# Patient Record
Sex: Male | Born: 1980 | Hispanic: Yes | Marital: Single | Smoking: Never smoker
Health system: Southern US, Community
[De-identification: ages and names within clinical notes are randomized; demographics above are authoritative.]

## PROBLEM LIST (undated history)

## (undated) DIAGNOSIS — I639 Cerebral infarction, unspecified: Secondary | ICD-10-CM

## (undated) HISTORY — PX: HERNIA REPAIR: SHX51

---

## 2020-05-05 ENCOUNTER — Encounter: Payer: Self-pay | Admitting: Emergency Medicine

## 2020-05-05 ENCOUNTER — Emergency Department: Payer: Self-pay

## 2020-05-05 ENCOUNTER — Inpatient Hospital Stay
Admission: EM | Admit: 2020-05-05 | Discharge: 2020-05-09 | DRG: 871 | Disposition: A | Discharge status: Home / Self Care | Payer: Self-pay | Attending: Internal Medicine | Admitting: Internal Medicine

## 2020-05-05 ENCOUNTER — Other Ambulatory Visit: Payer: Self-pay

## 2020-05-05 DIAGNOSIS — K760 Fatty (change of) liver, not elsewhere classified: Secondary | ICD-10-CM | POA: Diagnosis present

## 2020-05-05 DIAGNOSIS — R509 Fever, unspecified: Secondary | ICD-10-CM

## 2020-05-05 DIAGNOSIS — A419 Sepsis, unspecified organism: Principal | ICD-10-CM

## 2020-05-05 DIAGNOSIS — Y92239 Unspecified place in hospital as the place of occurrence of the external cause: Secondary | ICD-10-CM | POA: Diagnosis not present

## 2020-05-05 DIAGNOSIS — Z20822 Contact with and (suspected) exposure to covid-19: Secondary | ICD-10-CM | POA: Diagnosis present

## 2020-05-05 DIAGNOSIS — R0600 Dyspnea, unspecified: Secondary | ICD-10-CM

## 2020-05-05 DIAGNOSIS — J9601 Acute respiratory failure with hypoxia: Secondary | ICD-10-CM | POA: Diagnosis present

## 2020-05-05 DIAGNOSIS — R000 Tachycardia, unspecified: Secondary | ICD-10-CM

## 2020-05-05 DIAGNOSIS — J90 Pleural effusion, not elsewhere classified: Secondary | ICD-10-CM

## 2020-05-05 DIAGNOSIS — R079 Chest pain, unspecified: Secondary | ICD-10-CM

## 2020-05-05 DIAGNOSIS — T363X5A Adverse effect of macrolides, initial encounter: Secondary | ICD-10-CM | POA: Diagnosis not present

## 2020-05-05 DIAGNOSIS — J189 Pneumonia, unspecified organism: Secondary | ICD-10-CM | POA: Diagnosis present

## 2020-05-05 DIAGNOSIS — R220 Localized swelling, mass and lump, head: Secondary | ICD-10-CM | POA: Diagnosis not present

## 2020-05-05 DIAGNOSIS — Z79899 Other long term (current) drug therapy: Secondary | ICD-10-CM

## 2020-05-05 DIAGNOSIS — J181 Lobar pneumonia, unspecified organism: Secondary | ICD-10-CM | POA: Diagnosis present

## 2020-05-05 LAB — COMPREHENSIVE METABOLIC PANEL
ALT: 52 U/L — ABNORMAL HIGH (ref 0–44)
AST: 29 U/L (ref 15–41)
Albumin: 4.2 g/dL (ref 3.5–5.0)
Alkaline Phosphatase: 68 U/L (ref 38–126)
Anion gap: 11 (ref 5–15)
BUN: 13 mg/dL (ref 6–20)
CO2: 21 mmol/L — ABNORMAL LOW (ref 22–32)
Calcium: 9.3 mg/dL (ref 8.9–10.3)
Chloride: 104 mmol/L (ref 98–111)
Creatinine, Ser: 0.92 mg/dL (ref 0.61–1.24)
GFR, Estimated: >60 mL/min (ref >60)
Glucose, Bld: 134 mg/dL — ABNORMAL HIGH (ref 70–99)
Potassium: 3.8 mmol/L (ref 3.5–5.1)
Sodium: 136 mmol/L (ref 135–145)
Total Bilirubin: 1.9 mg/dL — ABNORMAL HIGH (ref 0.3–1.2)
Total Protein: 8 g/dL (ref 6.5–8.1)

## 2020-05-05 LAB — CBC
HCT: 40.2 % (ref 39.0–52.0)
Hemoglobin: 13.8 g/dL (ref 13.0–17.0)
MCH: 28.6 pg (ref 26.0–34.0)
MCHC: 34.3 g/dL (ref 30.0–36.0)
MCV: 83.2 fL (ref 80.0–100.0)
Platelets: 263 10*3/uL (ref 150–400)
RBC: 4.83 MIL/uL (ref 4.22–5.81)
RDW: 13.2 % (ref 11.5–15.5)
WBC: 22.6 10*3/uL — ABNORMAL HIGH (ref 4.0–10.5)
nRBC: 0 % (ref 0.0–0.2)

## 2020-05-05 LAB — LIPASE, BLOOD: Lipase: 29 U/L (ref 11–51)

## 2020-05-05 LAB — TROPONIN I (HIGH SENSITIVITY)
Troponin I (High Sensitivity): 4 ng/L (ref <18)
Troponin I (High Sensitivity): 2 ng/L (ref <18)

## 2020-05-05 LAB — SARS CORONAVIRUS 2 (TAT 6-24 HRS): SARS Coronavirus 2: NEGATIVE

## 2020-05-05 LAB — URINALYSIS, COMPLETE (UACMP) WITH MICROSCOPIC
Bilirubin Urine: NEGATIVE
Glucose, UA: 50 mg/dL — AB
Ketones, ur: 20 mg/dL — AB
Leukocytes,Ua: NEGATIVE
Nitrite: NEGATIVE
Protein, ur: 30 mg/dL — AB
Specific Gravity, Urine: 1.015 (ref 1.005–1.030)
Squamous Epithelial / HPF: NONE SEEN (ref 0–5)
pH: 6 (ref 5.0–8.0)

## 2020-05-05 LAB — PROTIME-INR
INR: 1.1 (ref 0.8–1.2)
Prothrombin Time: 14.5 seconds (ref 11.4–15.2)

## 2020-05-05 LAB — GLUCOSE, CAPILLARY: Glucose-Capillary: 121 mg/dL — ABNORMAL HIGH (ref 70–99)

## 2020-05-05 LAB — LACTIC ACID, PLASMA
Lactic Acid, Venous: 1.1 mmol/L (ref 0.5–1.9)
Lactic Acid, Venous: 2.1 mmol/L (ref 0.5–1.9)
Lactic Acid, Venous: 1.6 mmol/L (ref 0.5–1.9)

## 2020-05-05 LAB — APTT: aPTT: 34 seconds (ref 24–36)

## 2020-05-05 MED ORDER — IOHEXOL 350 MG/ML SOLN
75.0000 mL | Freq: Once | INTRAVENOUS | Status: AC | PRN
  Administered 2020-05-05: 75 mL via INTRAVENOUS

## 2020-05-05 MED ORDER — ENOXAPARIN SODIUM 40 MG/0.4ML ~~LOC~~ SOLN
40.0000 mg | SUBCUTANEOUS | Status: DC
  Administered 2020-05-05 – 2020-05-06 (×2): 40 mg via SUBCUTANEOUS
  Filled 2020-05-05: qty 0.4

## 2020-05-05 MED ORDER — MORPHINE SULFATE (PF) 4 MG/ML IV SOLN
4.0000 mg | Freq: Once | INTRAVENOUS | Status: AC
  Administered 2020-05-05: 4 mg via INTRAVENOUS
  Filled 2020-05-05: qty 1

## 2020-05-05 MED ORDER — ONDANSETRON HCL 4 MG/2ML IJ SOLN: 4.0000 mg | Freq: Four times a day (QID) | INTRAMUSCULAR | Status: DC | PRN

## 2020-05-05 MED ORDER — KETOROLAC TROMETHAMINE 15 MG/ML IJ SOLN
15.0000 mg | Freq: Three times a day (TID) | INTRAMUSCULAR | Status: AC | PRN
  Administered 2020-05-05 – 2020-05-07 (×4): 15 mg via INTRAVENOUS
  Filled 2020-05-05 (×7): qty 1

## 2020-05-05 MED ORDER — SODIUM CHLORIDE 0.9 % IV SOLN
2.0000 g | Freq: Once | INTRAVENOUS | Status: AC
  Administered 2020-05-05: 2 g via INTRAVENOUS
  Filled 2020-05-05: qty 20

## 2020-05-05 MED ORDER — LACTATED RINGERS IV BOLUS
1000.0000 mL | Freq: Once | INTRAVENOUS | Status: AC
  Administered 2020-05-05: 1000 mL via INTRAVENOUS

## 2020-05-05 MED ORDER — SODIUM CHLORIDE 0.9 % IV SOLN
500.0000 mg | INTRAVENOUS | Status: DC
  Administered 2020-05-05 – 2020-05-07 (×3): 500 mg via INTRAVENOUS
  Filled 2020-05-05 (×3): qty 500

## 2020-05-05 MED ORDER — ACETAMINOPHEN 325 MG PO TABS
650.0000 mg | ORAL_TABLET | Freq: Four times a day (QID) | ORAL | Status: DC | PRN
  Administered 2020-05-05 – 2020-05-08 (×6): 650 mg via ORAL
  Filled 2020-05-05 (×8): qty 2

## 2020-05-05 MED ORDER — SODIUM CHLORIDE 0.9 % IV SOLN
500.0000 mg | Freq: Once | INTRAVENOUS | Status: DC
  Filled 2020-05-05: qty 500

## 2020-05-05 MED ORDER — MORPHINE SULFATE (PF) 4 MG/ML IV SOLN
4.0000 mg | INTRAVENOUS | Status: DC | PRN
Start: 2020-05-05 — End: 2020-05-09
  Administered 2020-05-06: 4 mg via INTRAVENOUS
  Filled 2020-05-05: qty 1

## 2020-05-05 MED ORDER — SODIUM CHLORIDE 0.9 % IV BOLUS
1000.0000 mL | Freq: Once | INTRAVENOUS | Status: AC
  Administered 2020-05-05: 1000 mL via INTRAVENOUS

## 2020-05-05 MED ORDER — SODIUM CHLORIDE 0.9 % IV SOLN
2.0000 g | INTRAVENOUS | Status: DC
  Administered 2020-05-06 – 2020-05-08 (×3): 2 g via INTRAVENOUS
  Filled 2020-05-05 (×2): qty 20
  Filled 2020-05-05: qty 2
  Filled 2020-05-05: qty 20
  Filled 2020-05-05: qty 2

## 2020-05-05 MED ORDER — ALBUTEROL SULFATE (2.5 MG/3ML) 0.083% IN NEBU
2.5000 mg | INHALATION_SOLUTION | Freq: Once | RESPIRATORY_TRACT | Status: AC | PRN
  Administered 2020-05-05: 2.5 mg via RESPIRATORY_TRACT
  Filled 2020-05-05: qty 3

## 2020-05-05 MED ORDER — SODIUM CHLORIDE 0.9 % IV SOLN: 2.0000 g | INTRAVENOUS | Status: DC

## 2020-05-05 MED ORDER — SODIUM CHLORIDE 0.9 % IV SOLN: INTRAVENOUS | Status: DC

## 2020-05-05 NOTE — ED Notes (Signed)
MD Agbata sent secure chat about patient condition, has not responded to page.

## 2020-05-05 NOTE — Progress Notes (Signed)
Cross-coverage note:   Patient seen for fever, tachycardia, and tachypnea.   He denies any PMHx, presented with severe left flank pain and pleuritic chest pain and had CTA negative for PE but concerning for possible RLL pneumonia with small effusion. He was cultured, started on Rocephin and azithromycin, and given 3 liter IVF today.   He is resting and reports feeling better after Toradol and neb treatment but has severe right CVA tenderness. Plan to check CT abd/pelvis, control pain, and otherwise continue current management.

## 2020-05-05 NOTE — ED Notes (Addendum)
Pt attempted to stand to use urinal, pt immediately endorsing severe right sided chest pain, became tachypneic with respiration rate in the 40s and tachycardic with heart rate in the 150s. Pt's O2 saturation also decreased to 88%. Pt immediately assisted back into bed by this RN and placed on 2L nasal cannula. After O2 administration and pt lying back down on stretcher, pt's O2 increased to 98% on 2L nasal cannula, heart rate decreased to 117, and respirations decreased to 28. Pt still endorsing severe pain and only has tylenol ordered at this time. Agbata MD paged by secretary to make aware.

## 2020-05-05 NOTE — ED Notes (Signed)
Lab advising lactic acid 2.1, MD Agbata paged to make aware.

## 2020-05-05 NOTE — ED Triage Notes (Signed)
Pt arrived via POV with reports R rib cage pain and shortness of breath. Pt recently returned from the [place] where he had hernia surgery there about 3 weeks ago.  Pain and shortness of breath worse this morning.

## 2020-05-05 NOTE — Progress Notes (Signed)
   05/05/20 1821  Assess: MEWS Score  Temp (!) 102.6 F (39.2 C)  BP 128/85  Pulse Rate (!) 127  SpO2 97 %  Assess: MEWS Score  MEWS Temp 2  MEWS Systolic 0  MEWS Pulse 2  MEWS RR 0  MEWS LOC 0  MEWS Score 4  MEWS Score Color Red  Treat  MEWS Interventions Administered scheduled meds/treatments;Administered prn meds/treatments;Escalated (See documentation below)  Pain Scale 0-10  Pain Score 9  Pain Type Acute pain  Pain Location Back  Take Vital Signs  Increase Vital Sign Frequency  Red: Q 1hr X 4 then Q 4hr X 4, if remains red, continue Q 4hrs  Escalate  MEWS: Escalate Red: discuss with charge nurse/RN and provider, consider discussing with RRT  Notify: Charge Nurse/RN  Name of Charge Nurse/RN Notified Lupita Leash  Date Charge Nurse/RN Notified 05/05/20  Time Charge Nurse/RN Notified 1834  Notify: Provider  Provider Name/Title Agbata  Date Provider Notified 05/05/20  Time Provider Notified [phone]  Notification Type Call  Notification Reason Change in status  Provider response No new orders  Date of Provider Response 05/05/20  Time of Provider Response 1836  Notify: Rapid Response  Name of Rapid Response RN Notified t  Date Rapid Response Notified 05/05/20  Time Rapid Response Notified 1836

## 2020-05-05 NOTE — ED Notes (Signed)
Patient transported to CT 

## 2020-05-05 NOTE — Progress Notes (Signed)
   05/05/20 1612  Assess: MEWS Score  Temp 98.1 F (36.7 C)  BP 132/82  Pulse Rate (!) 118  Resp 18  SpO2 97 %  O2 Device Room Air  Assess: MEWS Score  MEWS Temp 0  MEWS Systolic 0  MEWS Pulse 2  MEWS RR 0  MEWS LOC 0  MEWS Score 2  MEWS Score Color Yellow  Assess: if the MEWS score is Yellow or Red  Were vital signs taken at a resting state? Yes  Focused Assessment No change from prior assessment  Early Detection of Sepsis Score *See Row Information* Medium  MEWS guidelines implemented *See Row Information* No, previously yellow, continue vital signs every 4 hours  Treat  MEWS Interventions Other (Comment) (Paged MD no response)  Pain Scale 0-10  Pain Score 0  Take Vital Signs  Increase Vital Sign Frequency  Yellow: Q 2hr X 2 then Q 4hr X 2, if remains yellow, continue Q 4hrs  Escalate  MEWS: Escalate Yellow: discuss with charge nurse/RN and consider discussing with provider and RRT  Notify: Charge Nurse/RN  Name of Charge Nurse/RN Notified Lupita Leash  Date Charge Nurse/RN Notified 05/05/20  Time Charge Nurse/RN Notified 1630  Notify: Provider  Provider Name/Title Asbata  Date Provider Notified 05/05/20  Time Provider Notified 1630  Notification Type Call  Notification Reason Change in status  Provider response No new orders

## 2020-05-05 NOTE — H&P (Signed)
History and Physical    Kemonte Ullman URK:[id number] DOB: 24-May-1980 DOA: 05/05/2020  PCP: Pcp, No   Patient coming from: Home  I have personally briefly reviewed patient's old medical records in [facility]  Chief Complaint: Chest pain  HPI: Coreon Simkins is a 40 y.o. male with medical history significant for recent inguinal hernia repair in the [place] who presents to the ER via private vehicle for evaluation of pain mostly in the right lower lateral chest wall associated with shortness of breath, fever and a cough. Patient started having symptoms 3 days ago soon after he returned from the [place] where he had hernia surgery.  He describes the pain as pleuritic which prevents him from taking a deep breaths.  He denies having any trauma or falls.  He has a nonproductive cough and he has had fever though undocumented. Due to persistence of his symptoms he presented to the ER for further evaluation.Marland Kitchen He denies having any abdominal pain, no nausea, no vomiting, no urinary frequency, no nocturia, no dysuria, no dizziness, no lightheadedness, no changes in his bowel habits, no blurred vision, no palpitations or diaphoresis. Labs show sodium 130, potassium 3.8, chloride 104, bicarb 21, glucose 134, BUN 13, creatinine 0.92, calcium 9.3, alkaline phosphatase 68, albumin 4.2, lipase 29, AST 29, ALT 52, total protein 8.0, total bilirubin 1.9, troponin 2, lactic acid 1.1, white count 22.6, hemoglobin 13.8, hematocrit 40.2, MCV 83.2, RDW 13.2, platelet count 263, PT 14.5, INR 1.1 Chest x-ray reviewed by me and agree with radiologist report shows low lung volumes with bibasilar atelectasis/infiltrates. CT angiogram of the chest shows no definite evidence of pulmonary embolus. Small right pleural effusion is noted with adjacent right lower lobe pneumonia or atelectasis. Subsegmental atelectasis is noted in both upper lobes and the left lower lobe. Hepatic steatosis. Twelve-lead  EKG reviewed by me shows sinus tachycardia with T wave inversion in inferior leads.   ED Course: Patient is a 40 year old Hispanic male who presents to the emergency room for evaluation of pleuritic chest pain mostly involving the right lower lateral chest wall associated with a nonproductive cough, shortness of breath and fever.  Patient is status post hernia repair which was done about 3 weeks ago and just returned from the [place] 4 days prior to his admission.  CT angiogram is negative for PE but shows a small right pleural effusion with right lower lobe pneumonia.  Patient received IV Rocephin and Zithromax in the ER and will be admitted to the hospital for further evaluation.   Review of Systems: As per HPI otherwise all other systems reviewed and negative.    History reviewed. No pertinent past medical history.  Past Surgical History:  Procedure Laterality Date  . HERNIA REPAIR       reports that he has never smoked. He has never used smokeless tobacco. He reports previous alcohol use. No history on file for drug use.  No Known Allergies  Family History  Family history unknown: Yes      Prior to Admission medications   Medication Sig Start Date End Date Taking? Authorizing Provider  acetaminophen (TYLENOL) 325 MG tablet Take 650 mg by mouth every 6 (six) hours as needed.   Yes [provider]  naproxen (EC NAPROSYN) 500 MG EC tablet Take 500 mg by mouth 2 (two) times daily. Patient not taking: Reported on 05/05/2020 04/04/20   [provider]    Physical Exam: Vitals:   05/05/20 8315 05/05/20 0955 05/05/20 1000  05/05/20 1015  BP:   131/83   Pulse: (!) 150 (!) 117 (!) 106 (!) 109  Resp: (!) 40 (!) 28 (!) 27 (!) 24  Temp:      TempSrc:      SpO2: (!) 88% 98% 99% 100%  Weight:      Height:         Vitals:   05/05/20 0954 05/05/20 0955 05/05/20 1000 05/05/20 1015  BP:   131/83   Pulse: (!) 150 (!) 117 (!) 106 (!) 109  Resp: (!) 40 (!) 28  (!) 27 (!) 24  Temp:      TempSrc:      SpO2: (!) 88% 98% 99% 100%  Weight:      Height:          Constitutional: Alert and oriented x 3 .  Acutely ill-appearing HEENT:      Head: Normocephalic and atraumatic.         Eyes: PERLA, EOMI, Conjunctivae are normal. Sclera is non-icteric.       Mouth/Throat: Mucous membranes are moist.       Neck: Supple with no signs of meningismus. Cardiovascular:  Tachycardic. No murmurs, gallops, or rubs. 2+ symmetrical distal pulses are present . No JVD. No LE edema Respiratory:  Tachypneic.rhonchi right lung base. No wheezes, crackles, or rhonchi.  Gastrointestinal: Soft, non tender, and non distended with positive bowel sounds.  Genitourinary: No CVA tenderness. Musculoskeletal: Nontender with normal range of motion in all extremities. No cyanosis, or erythema of extremities. Neurologic:  Face is symmetric. Moving all extremities. No gross focal neurologic deficits  Skin: Skin is warm, dry.  No rash or ulcers Psychiatric: Mood and affect are normal   Labs on Admission: I have personally reviewed following labs and imaging studies  CBC: Recent Labs  Lab 05/05/20 0646  WBC 22.6*  HGB 13.8  HCT 40.2  MCV 83.2  PLT 263   Basic Metabolic Panel: Recent Labs  Lab 05/05/20 0646  NA 136  K 3.8  CL 104  CO2 21*  GLUCOSE 134*  BUN 13  CREATININE 0.92  CALCIUM 9.3   GFR: Estimated Creatinine Clearance: 88.5 mL/min (by C-G formula based on SCr of 0.92 mg/dL). Liver Function Tests: Recent Labs  Lab 05/05/20 0646  AST 29  ALT 52*  ALKPHOS 68  BILITOT 1.9*  PROT 8.0  ALBUMIN 4.2   Recent Labs  Lab 05/05/20 0646  LIPASE 29   No results for input(s): AMMONIA in the last 168 hours. Coagulation Profile: Recent Labs  Lab 05/05/20 0646  INR 1.1   Cardiac Enzymes: No results for input(s): CKTOTAL, CKMB, CKMBINDEX, TROPONINI in the last 168 hours. BNP (last 3 results) No results for input(s): PROBNP in the last 8760  hours. HbA1C: No results for input(s): HGBA1C in the last 72 hours. CBG: No results for input(s): GLUCAP in the last 168 hours. Lipid Profile: No results for input(s): CHOL, HDL, LDLCALC, TRIG, CHOLHDL, LDLDIRECT in the last 72 hours. Thyroid Function Tests: No results for input(s): TSH, T4TOTAL, FREET4, T3FREE, THYROIDAB in the last 72 hours. Anemia Panel: No results for input(s): VITAMINB12, FOLATE, FERRITIN, TIBC, IRON, RETICCTPCT in the last 72 hours. Urine analysis: No results found for: COLORURINE, APPEARANCEUR, LABSPEC, PHURINE, GLUCOSEU, HGBUR, BILIRUBINUR, KETONESUR, PROTEINUR, UROBILINOGEN, NITRITE, LEUKOCYTESUR  Radiological Exams on Admission: CT Angio Chest PE W and/or Wo Contrast  Result Date: 05/05/2020 CLINICAL DATA:  Chest pain. EXAM: CT ANGIOGRAPHY CHEST WITH CONTRAST TECHNIQUE: Multidetector CT imaging of the chest  was performed using the standard protocol during bolus administration of intravenous contrast. Multiplanar CT image reconstructions and MIPs were obtained to evaluate the vascular anatomy. CONTRAST:  25mL OMNIPAQUE IOHEXOL 350 MG/ML SOLN COMPARISON:  None. FINDINGS: Cardiovascular: Satisfactory opacification of the pulmonary arteries to the segmental level. No evidence of pulmonary embolism. Normal heart size. No pericardial effusion. Mediastinum/Nodes: No enlarged mediastinal, hilar, or axillary lymph nodes. Thyroid gland, trachea, and esophagus demonstrate no significant findings. Lungs/Pleura: No pneumothorax is noted. Small right pleural effusion is noted with adjacent right lower lobe pneumonia or atelectasis. Mild subsegmental atelectasis is noted in both upper lobes and the left lower lobe. Upper Abdomen: Hepatic steatosis. Musculoskeletal: No chest wall abnormality. No acute or significant osseous findings. Review of the MIP images confirms the above findings. IMPRESSION: No definite evidence of pulmonary embolus. Small right pleural effusion is noted with  adjacent right lower lobe pneumonia or atelectasis. Subsegmental atelectasis is noted in both upper lobes and the left lower lobe. Hepatic steatosis. Electronically Signed   By: Lupita Raider M.D.   On: 05/05/2020 08:25   DG Chest Port 1 View  Result Date: 05/05/2020 CLINICAL DATA:  Shortness of breath.  Pleuritic right chest pain. EXAM: PORTABLE CHEST 1 VIEW COMPARISON:  No prior. FINDINGS: Mediastinum hilar structures normal. Heart size normal. Low lung volumes with bibasilar atelectasis/infiltrates. No pleural effusion or pneumothorax. IMPRESSION: Low lung volumes with bibasilar atelectasis/infiltrates. Electronically Signed   By: Maisie Fus  Register   On: 05/05/2020 07:06     Assessment/Plan Active Problems:   CAP (community acquired pneumonia)     Community-acquired pneumonia Patient presents for evaluation of pleuritic chest pain associated with fever, shortness of breath and a nonproductive cough. CT angiogram is negative for PE but shows a small right pleural effusion and lower lobe infiltrate. We will place patient on Rocephin and Zithromax to complete 5 days of therapy We will place patient on IV Toradol as needed for pain control Follow-up results of blood cultures   DVT prophylaxis: Lovenox Code Status: full code Family Communication:  none Disposition Plan: Back to previous home environment Consults called: none Status: At the time of admission, it appears that the appropriate admission status for this patient is inpatient. This is judged to be reasonable and necessary in order to provide the required intensity of service to ensure the patient's safety given the presenting symptoms, physical exam findings, and initial radiographic and laboratory data in the context of their comorbid conditions. Patient requires inpatient status due to high intensity of service, high risk for further deterioration and high frequency of surveillance required.   Lucile Shutters MD [facility]     05/05/2020, 10:37 AM

## 2020-05-05 NOTE — Progress Notes (Signed)
   05/05/20 1612  Assess: MEWS Score  Temp 98.1 F (36.7 C)  BP 132/82  Pulse Rate (!) 118  Resp 18  SpO2 97 %  O2 Device Room Air  Assess: MEWS Score  MEWS Temp 0  MEWS Systolic 0  MEWS Pulse 2  MEWS RR 0  MEWS LOC 0  MEWS Score 2  MEWS Score Color Yellow

## 2020-05-05 NOTE — Progress Notes (Addendum)
Was called by RN that patient with red mucous with a fever of 102.6 and tachycardic and rapid response had been called. Came to assess patient General: NAD Respiration: Some decreased breath sounds in the right base.  Fair air movement.  No crackles.  No wheezing. Cardiovascular: Tachycardia.  No murmurs rubs or gallops. GI: Soft, nontender, nondistended, positive bowel sounds.  Incision site left inguinal region C/C/I Extremities: No clubbing cyanosis or edema  Assessment/plan 1.  Fever/tachycardia -Patient admitted with community-acquired pneumonia.  Patient when I assessed the patient with complaints of right-sided pain. -Patient stated given some Tylenol and feeling much better with some improvement with pain. -Patient has been pancultured blood cultures pending. -Check a UA with cultures and sensitivities. -Patient on Tylenol, IV fluids, IV antibiotics for CAP -IV fluid bolus.  Place on normal saline 125 cc an hour. -Continue pain management. -Follow.   Time spent 20 minutes

## 2020-05-05 NOTE — ED Provider Notes (Signed)
[facility] Emergency Department Provider Note   ____________________________________________   Event Date/Time   First MD Initiated Contact with Patient 05/05/20 [phone]     (approximate)  I have reviewed the triage vital signs and the nursing notes.   HISTORY  Chief Complaint Shortness of Breath    HPI Joel Joseph is a 40 y.o. male presents for evaluation of right-sided chest and rib pain  Patient reports for about 2 days now has had a pain with increasing discomfort over his right rib cage the right middle of his rib cage that radiates towards his right back.  Came on rather abruptly and is worse in the last 2 days.  Is associated with feeling of shortness of breath.  No fevers or chills.  No abdominal pain no nausea vomiting.  In particular is not having any pain or discomfort around his hernia repair site, and reports 22 days ago he had a left groin hernia repair to the [place].  He did well and was recovering well until about 2 days ago when he began experiencing right-sided chest and rib pain.  Denies any pain in his abdomen including his right upper abdomen  Normal bowel movements.  No fever no headache no neck pain.  No history of blood clots.  Has not noticed any leg swelling.  Does not use alcohol heavily, has no history of previous surgeries.  Flew back from the [place] about 3 days ago   History reviewed. No pertinent past medical history.  There are no problems to display for this patient.   Past Surgical History:  Procedure Laterality Date  . HERNIA REPAIR      Prior to Admission medications   Not on File    Allergies Patient has no known allergies.  No family history on file.  Social History Social History   Tobacco Use  . Smoking status: Never Smoker  . Smokeless tobacco: Never Used  Substance Use Topics  . Alcohol use: Not Currently    Review of Systems Constitutional: No fever/chills Eyes: No visual  changes. ENT: No sore throat. Cardiovascular: See HPI Respiratory: See HPI Gastrointestinal: No abdominal pain.  Hernia site healing well and not causing any pain. Genitourinary: Negative for dysuria. Musculoskeletal: Negative for back pain except in his mid upper back radiates from the chest on the right. Skin: Negative for rash. Neurological: Negative for headaches, areas of focal weakness or numbness.    ____________________________________________   PHYSICAL EXAM:  VITAL SIGNS: ED Triage Vitals  Enc Vitals Group     BP 05/05/20 0634 126/77     Pulse Rate 05/05/20 0634 (!) 123     Resp 05/05/20 0634 (!) 24     Temp 05/05/20 0634 98.4 F (36.9 C)     Temp Source 05/05/20 0634 Oral     SpO2 05/05/20 0634 97 %     Weight 05/05/20 0636 136 lb (61.7 kg)     Height 05/05/20 0636 5\' 4"  (1.626 m)     Head Circumference --      Peak Flow --      Pain Score --      Pain Loc --      Pain Edu? --      Excl. in GC? --     Constitutional: Alert and oriented.  In painful extremitas, appears to be in pain reporting significant sharp right-sided chest pain.  He is also modestly tachycardic.  He does not appear in acute respiratory or neurologic  distress.  Peers in pain though Eyes: Conjunctivae are normal. Head: Atraumatic. Nose: No congestion/rhinnorhea. Mouth/Throat: Mucous membranes are moist. Neck: No stridor.  Cardiovascular: Tachycardic rate, regular rhythm. Grossly normal heart sounds.  Good peripheral circulation. Respiratory: Normal respiratory effort.  Pleuritic chest pain is evident.  Splints when taking a deep breath.  Lungs CTAB. Gastrointestinal: Soft and nontender. No distention.  Left groin site clean dry and intact without evidence of erythema, dehiscence, or infection.  Negative Murphy.  No pain to palpation in any quadrant of the abdomen. Musculoskeletal: No lower extremity tenderness nor edema. Neurologic:  Normal speech and language. No gross focal neurologic  deficits are appreciated.  Skin:  Skin is warm, dry and intact. No rash noted.  No rash on the back or chest. Psychiatric: Mood and affect are slightly anxious, reports in pain. Speech and behavior are normal.  ____________________________________________   LABS (all labs ordered are listed, but only abnormal results are displayed)  Labs Reviewed  CBC - Abnormal; Notable for the following components:      Result Value   WBC 22.6 (*)    All other components within normal limits  SARS CORONAVIRUS 2 (TAT 6-24 HRS)  PROTIME-INR  APTT  COMPREHENSIVE METABOLIC PANEL  LIPASE, BLOOD  TROPONIN I (HIGH SENSITIVITY)   ____________________________________________  EKG  Reviewed inter by me at 640 Heart rate 150 QRS 80 QTc 440 Sinus tachycardia, without evidence of obvious ischemia however I am concerned that there may be some right ventricular strain pattern evident ____________________________________________  RADIOLOGY  DG Chest Port 1 View  Result Date: 05/05/2020 CLINICAL DATA:  Shortness of breath.  Pleuritic right chest pain. EXAM: PORTABLE CHEST 1 VIEW COMPARISON:  No prior. FINDINGS: Mediastinum hilar structures normal. Heart size normal. Low lung volumes with bibasilar atelectasis/infiltrates. No pleural effusion or pneumothorax. IMPRESSION: Low lung volumes with bibasilar atelectasis/infiltrates. Electronically Signed   By: Maisie Fus  Register   On: 05/05/2020 07:06     ____________________________________________   PROCEDURES  Procedure(s) performed: None  Procedures  Critical Care performed: No  ____________________________________________   INITIAL IMPRESSION / ASSESSMENT AND PLAN / ED COURSE  Pertinent labs & imaging results that were available during my care of the patient were reviewed by me and considered in my medical decision making (see chart for details).   Differential diagnosis includes, but is not limited to, ACS, aortic dissection, pulmonary  embolism, cardiac tamponade, pneumothorax, pneumonia, pericarditis, myocarditis, GI-related causes including esophagitis/gastritis, and musculoskeletal chest wall pain.    Given the patient's presentation, recent clinical history I have a high concern for potential acute right chest pathology.  Highness differential includes pulmonary embolism.  No associated intra-abdominal pain very reassuring abdominal exam.  ----------------------------------------- 7:20 AM on 05/05/2020 -----------------------------------------  Ongoing care assigned to Dr. Scotty Court.  Follow-up on pending work-up, reassessment, and further work-up for the patient's chest pain.      ____________________________________________   FINAL CLINICAL IMPRESSION(S) / ED DIAGNOSES  Final diagnoses:  Right-sided chest pain        Note:  This document was prepared using Dragon voice recognition software and may include unintentional dictation errors       Sharyn Creamer, MD 05/10/20 1648

## 2020-05-05 NOTE — Plan of Care (Signed)

## 2020-05-05 NOTE — Progress Notes (Addendum)
Rapid Response Event Note   Reason for Call :   - Red MEWS  Initial Focused Assessment:   - Patient is AA+Ox4 - No obvious distress - C/O mild HA and lower back pain - ST on telemetry - VS: 102.6/129/15/(143/90)/97%   Interventions:   - Patient is on IVF, antibiotics - PRN APAP given by bedside RN - Bedside RN states she has already spoken with Dr. Janee Morn  Plan of Care:   - Continue current treatment  Event Summary:   MD Notified: Dr. Janee Morn Call Time: 1832 hrs Arrival Time: 1835 hrs End Time: 1845 hrs  Rosana Fret, RN    Follow-up: 2145 hrs: VS improved.

## 2020-05-05 NOTE — ED Notes (Signed)
Lab advising pt's lactic acid is 1.1, MD Agbata paged to make aware.

## 2020-05-05 NOTE — ED Provider Notes (Signed)
Procedures     ----------------------------------------- 9:10 AM on 05/05/2020 -----------------------------------------  Patient's clinical presentation was initially concerning for pulmonary embolism.  However, CT scan is negative for PE but does show a right lower lobe pneumonia that was not clinically apparent.  With tachycardia and tachypnea, this is concerning for sepsis at this time.  Will obtain lactate blood culture, start Rocephin and azithromycin and plan to admit.  No signs of shock.  We will give 1 L saline bolus.    Sharman Cheek, MD 05/05/20 [phone]

## 2020-05-05 NOTE — Progress Notes (Signed)
Rapid Response Event Note   Reason for Call :   - Tachycardia, tachypnea, fever  Initial Focused Assessment:   - Patient is awake and alert; states the month is "March" - HR 120s per tele box, manual resps of 42 bpm; temp 100.5 - Reporting pain in his right side, tender to touch - BP slightly elevated - Patient is sweaty - Lungs are wheezy and tight to auscultation  Interventions:   - Dr. Antionette Char paged to bedside - 12-lead EKG obtained (patient reports chest tightness) (per RRT standing orders) - Albuterol neb ordered (per RRT standing orders) and given by RT. - Patient states his breathing feels "normal" after the neb. - PRN toradol for pain per [facility] by patient's bedside RN  Plan of Care:   - CT Abdomen in the morning, per Dr. Antionette Char - Continue current treatment for now  Event Summary:   MD Notified: Dr. Antionette Char Call Time: 2216 hrs Arrival Time: 2218 hrs End Time: 2300 hrs  Rosana Fret, RN

## 2020-05-06 ENCOUNTER — Inpatient Hospital Stay: Payer: Self-pay

## 2020-05-06 ENCOUNTER — Encounter: Payer: Self-pay | Admitting: Internal Medicine

## 2020-05-06 DIAGNOSIS — J9601 Acute respiratory failure with hypoxia: Secondary | ICD-10-CM

## 2020-05-06 DIAGNOSIS — J181 Lobar pneumonia, unspecified organism: Secondary | ICD-10-CM

## 2020-05-06 LAB — CBC WITH DIFFERENTIAL/PLATELET
Abs Immature Granulocytes: 0.07 10*3/uL (ref 0.00–0.07)
Basophils Absolute: 0 10*3/uL (ref 0.0–0.1)
Basophils Relative: 0 %
Eosinophils Absolute: 0 10*3/uL (ref 0.0–0.5)
Eosinophils Relative: 0 %
HCT: 34.8 % — ABNORMAL LOW (ref 39.0–52.0)
Hemoglobin: 12 g/dL — ABNORMAL LOW (ref 13.0–17.0)
Immature Granulocytes: 1 %
Lymphocytes Relative: 6 %
Lymphs Abs: 0.7 10*3/uL (ref 0.7–4.0)
MCH: 28.4 pg (ref 26.0–34.0)
MCHC: 34.5 g/dL (ref 30.0–36.0)
MCV: 82.3 fL (ref 80.0–100.0)
Monocytes Absolute: 0.8 10*3/uL (ref 0.1–1.0)
Monocytes Relative: 6 %
Neutro Abs: 11.8 10*3/uL — ABNORMAL HIGH (ref 1.7–7.7)
Neutrophils Relative %: 87 %
Platelets: 235 10*3/uL (ref 150–400)
RBC: 4.23 MIL/uL (ref 4.22–5.81)
RDW: 12.9 % (ref 11.5–15.5)
WBC: 13.4 10*3/uL — ABNORMAL HIGH (ref 4.0–10.5)
nRBC: 0 % (ref 0.0–0.2)

## 2020-05-06 LAB — COMPREHENSIVE METABOLIC PANEL
ALT: 31 U/L (ref 0–44)
AST: 21 U/L (ref 15–41)
Albumin: 3.2 g/dL — ABNORMAL LOW (ref 3.5–5.0)
Alkaline Phosphatase: 61 U/L (ref 38–126)
Anion gap: 8 (ref 5–15)
BUN: 10 mg/dL (ref 6–20)
CO2: 21 mmol/L — ABNORMAL LOW (ref 22–32)
Calcium: 8.4 mg/dL — ABNORMAL LOW (ref 8.9–10.3)
Chloride: 107 mmol/L (ref 98–111)
Creatinine, Ser: 0.94 mg/dL (ref 0.61–1.24)
GFR, Estimated: >60 mL/min (ref >60)
Glucose, Bld: 160 mg/dL — ABNORMAL HIGH (ref 70–99)
Potassium: 3.3 mmol/L — ABNORMAL LOW (ref 3.5–5.1)
Sodium: 136 mmol/L (ref 135–145)
Total Bilirubin: 0.9 mg/dL (ref 0.3–1.2)
Total Protein: 6.6 g/dL (ref 6.5–8.1)

## 2020-05-06 LAB — BASIC METABOLIC PANEL
Anion gap: 8 (ref 5–15)
BUN: 10 mg/dL (ref 6–20)
CO2: 23 mmol/L (ref 22–32)
Calcium: 8.3 mg/dL — ABNORMAL LOW (ref 8.9–10.3)
Chloride: 108 mmol/L (ref 98–111)
Creatinine, Ser: 1 mg/dL (ref 0.61–1.24)
GFR, Estimated: >60 mL/min (ref >60)
Glucose, Bld: 101 mg/dL — ABNORMAL HIGH (ref 70–99)
Potassium: 3.8 mmol/L (ref 3.5–5.1)
Sodium: 139 mmol/L (ref 135–145)

## 2020-05-06 LAB — LACTIC ACID, PLASMA
Lactic Acid, Venous: 1.3 mmol/L (ref 0.5–1.9)
Lactic Acid, Venous: 1.1 mmol/L (ref 0.5–1.9)

## 2020-05-06 LAB — CBC
HCT: 38.7 % — ABNORMAL LOW (ref 39.0–52.0)
Hemoglobin: 13.3 g/dL (ref 13.0–17.0)
MCH: 28.8 pg (ref 26.0–34.0)
MCHC: 34.4 g/dL (ref 30.0–36.0)
MCV: 83.8 fL (ref 80.0–100.0)
Platelets: 224 10*3/uL (ref 150–400)
RBC: 4.62 MIL/uL (ref 4.22–5.81)
RDW: 13.1 % (ref 11.5–15.5)
WBC: 13.1 10*3/uL — ABNORMAL HIGH (ref 4.0–10.5)
nRBC: 0 % (ref 0.0–0.2)

## 2020-05-06 LAB — HIV ANTIBODY (ROUTINE TESTING W REFLEX): HIV Screen 4th Generation wRfx: NONREACTIVE

## 2020-05-06 LAB — APTT: aPTT: 39 seconds — ABNORMAL HIGH (ref 24–36)

## 2020-05-06 LAB — PROTIME-INR
INR: 1.2 (ref 0.8–1.2)
Prothrombin Time: 15 seconds (ref 11.4–15.2)

## 2020-05-06 LAB — STREP PNEUMONIAE URINARY ANTIGEN: Strep Pneumo Urinary Antigen: NEGATIVE

## 2020-05-06 MED ORDER — IPRATROPIUM-ALBUTEROL 0.5-2.5 (3) MG/3ML IN SOLN
3.0000 mL | Freq: Three times a day (TID) | RESPIRATORY_TRACT | Status: DC
  Administered 2020-05-06: 3 mL via RESPIRATORY_TRACT
  Filled 2020-05-06: qty 3

## 2020-05-06 MED ORDER — IOHEXOL 9 MG/ML PO SOLN
500.0000 mL | ORAL | Status: AC
  Administered 2020-05-06 (×2): 500 mL via ORAL

## 2020-05-06 MED ORDER — IPRATROPIUM-ALBUTEROL 0.5-2.5 (3) MG/3ML IN SOLN
3.0000 mL | Freq: Four times a day (QID) | RESPIRATORY_TRACT | Status: DC
  Administered 2020-05-06 – 2020-05-09 (×10): 3 mL via RESPIRATORY_TRACT
  Filled 2020-05-06 (×10): qty 3

## 2020-05-06 MED ORDER — GUAIFENESIN ER 600 MG PO TB12
600.0000 mg | ORAL_TABLET | Freq: Two times a day (BID) | ORAL | Status: DC
  Administered 2020-05-06 – 2020-05-09 (×7): 600 mg via ORAL
  Filled 2020-05-06 (×7): qty 1

## 2020-05-06 MED ORDER — IOHEXOL 300 MG/ML  SOLN
100.0000 mL | Freq: Once | INTRAMUSCULAR | Status: AC | PRN
  Administered 2020-05-06: 100 mL via INTRAVENOUS

## 2020-05-06 MED ORDER — SODIUM CHLORIDE 0.9 % IV BOLUS (SEPSIS)
1000.0000 mL | Freq: Once | INTRAVENOUS | Status: AC
  Administered 2020-05-06: 1000 mL via INTRAVENOUS

## 2020-05-06 NOTE — Progress Notes (Signed)
Rapid Follow up  Conversation with primary nurse Mia. Patient has had relief from neb treatment and pain medication. Patient is currently a sleep temp and heart rate has decreased.

## 2020-05-06 NOTE — Progress Notes (Signed)
Patient's vitals are improved as MEWs indicator. Pt's remains alert and oriented and presents with no apparent distress. He remains in high fowlers. Oxygen is titrated down to 2L then to 1L as he sats in the 96%.Supplemental Oxygen will remain in place throughout the shift. Patients states "I feel normal again".Will continue to monitor

## 2020-05-06 NOTE — Progress Notes (Signed)
2210: Received a call to the front desk about this patient having trouble breathing. As this RN was going to the patient's room it was noted that the patient's HR was in the 150's. Entering the room the patient was having a hard time breathing, almost gasping for air. This RN asked the secretary to call an RRT, notified the charge nurse and the provider. After use of a non-rebreather this patient was able to regain his breath. Vital signs were taken, a EKG was ordered which showed sinus tachycardia. Patient given toradol for abdominal pain. MD Opyd came to the bedside and ordered a CT of the abdomen and morphine. Patient now resting. Wife at bedside.

## 2020-05-06 NOTE — Progress Notes (Signed)
   05/06/20 2020  Assess: MEWS Score  Temp (!) 102.8 F (39.3 C)  BP 137/79  Pulse Rate (!) 121  SpO2 94 %  O2 Device Nasal Cannula  O2 Flow Rate (L/min) 3 L/min  Assess: MEWS Score  MEWS Temp 2  MEWS Systolic 0  MEWS Pulse 2  MEWS RR 1  MEWS LOC 0  MEWS Score 5  MEWS Score Color Red  Assess: if the MEWS score is Yellow or Red  Were vital signs taken at a resting state? Yes  Focused Assessment Change from prior assessment (see assessment flowsheet)  Early Detection of Sepsis Score *See Row Information* High  MEWS guidelines implemented *See Row Information* Yes  Treat  MEWS Interventions Administered scheduled meds/treatments;Escalated (See documentation below);Consulted Respiratory Therapy  Pain Scale 0-10  Pain Score 6  Pain Type Acute pain  Pain Location Head  Pain Orientation Anterior  Patients Stated Pain Goal 0  Pain Intervention(s) Medication (See eMAR)  Take Vital Signs  Increase Vital Sign Frequency  Red: Q 1hr X 4 then Q 4hr X 4, if remains red, continue Q 4hrs  Escalate  MEWS: Escalate Red: discuss with charge nurse/RN and provider, consider discussing with RRT  Notify: Charge Nurse/RN  Name of Charge Nurse/RN Notified Tricia,RN  Date Charge Nurse/RN Notified 05/06/20  Time Charge Nurse/RN Notified 2035  Notify: Provider  Provider Name/Title Dr. Lindajo Royal  Date Provider Notified 05/06/20  Time Provider Notified 2035  Notification Type Page  Notification Reason Change in status  Provider response See new orders  Date of Provider Response 05/06/20  Time of Provider Response 2039  Document  Patient Outcome Not stable and remains on department  Progress note created (see row info) Yes

## 2020-05-06 NOTE — Progress Notes (Signed)
Approximately 2030, patient's  vitals warranted Red mews protocol due abnormal heart rate of 121 and oral temp of 102.8. Rapid response team was notified. MD notified and code sepsis was initiated immediately. Patient's room temp is adjusted from 78 degrees to 68 and all blankets are removed. Pt became hypoxic while adjusting in bed and was unable to rebound. His oxygen saturation was 84 on room air, thus 3L of 02 was placed, patient is repositioned in high fowlers to promote oxygenation. Patient remain in high fowlers at this time. Oxygen is being delivered at 3l via [place].Patient remains on the unit at this time and does not appear in any distress.  Patient's wife is now at this bedside.

## 2020-05-06 NOTE — Progress Notes (Signed)
PROGRESS NOTE    Joel Joseph  PNT:[id number] DOB: [birth year] DOA: 05/05/2020 PCP: Pcp, No    Chief Complaint  Patient presents with  . Shortness of Breath    Brief Narrative:  Joel Joseph is a 40 y.o. male with medical history significant for recent inguinal hernia repair in the [place] who presents to the ER via private vehicle for evaluation of pain mostly in the right lower lateral chest wall associated with shortness of breath, fever and a cough. CTA negative for PE, found right lower lobe pneumonia  Subjective:  Right side chest pain, productive cough, DOE, family at bedside He is on room air at rest Reports nose bled x1  Assessment & Plan:   Active Problems:   CAP (community acquired pneumonia)  Right lower lobe pneumonia/acute hypoxic respiratory failure/sepsis present on admission -Fever 102.6, tachypneic respiration rate ranged from 21-38 on presentation, tachycardia heart rate 127, lactic acidosis, lactic acid 2.1, leukocytosis, WBC 22.6 -CTA chest negative for PE,  -Blood culture no growth COVID-19 screening negative -will check urine Legionella and urine strep pneumo antigen -Continue Rocephin and Zithromax     The patient's BMI is: Body mass index is 23.34 kg/m.Marland Kitchen     Unresulted Labs (From admission, onward)          Start     Ordered   05/12/20 0500  Creatinine, serum  (enoxaparin (LOVENOX)    CrCl >/= 30 ml/min)  Weekly,   STAT     Comments: while on enoxaparin therapy    05/05/20 0925   05/07/20 0500  CBC with Differential/Platelet  Tomorrow morning,   R        05/06/20 1902   05/07/20 0500  Comprehensive metabolic panel  Tomorrow morning,   R        05/06/20 1902   05/07/20 0500  Magnesium  Tomorrow morning,   R        05/06/20 1902   05/06/20 1224  Expectorated Sputum Assessment w Gram Stain, Rflx to Resp Cult  Once,   R        05/06/20 1223   05/06/20 0826  Legionella Pneumophila Serogp 1 Ur Ag  Once,   R        05/06/20  0825   05/05/20 1904  Urine Culture  Once,   R        05/05/20 1903            DVT prophylaxis: enoxaparin (LOVENOX) injection 40 mg Start: 05/05/20 0930   Code Status:full Family Communication: Family at bedside Disposition:   Status is: Inpatient   Dispo: The patient is from: Home              Anticipated d/c is to: Home              Anticipated d/c date is: 24 to 48 hours                Consultants:   None  Procedures:   None  Antimicrobials:   Anti-infectives (From admission, onward)   Start     Dose/Rate Route Frequency Ordered Stop   05/06/20 1000  cefTRIAXone (ROCEPHIN) 2 g in sodium chloride 0.9 % 100 mL IVPB        2 g 200 mL/hr over 30 Minutes Intravenous Every 24 hours 05/05/20 0955 05/10/20 0959   05/05/20 0930  cefTRIAXone (ROCEPHIN) 2 g in sodium chloride 0.9 % 100 mL IVPB  Status:  Discontinued  2 g 200 mL/hr over 30 Minutes Intravenous Every 24 hours 05/05/20 0925 05/05/20 0955   05/05/20 0930  azithromycin (ZITHROMAX) 500 mg in sodium chloride 0.9 % 250 mL IVPB        500 mg 250 mL/hr over 60 Minutes Intravenous Every 24 hours 05/05/20 0925 05/10/20 0929   05/05/20 0915  cefTRIAXone (ROCEPHIN) 2 g in sodium chloride 0.9 % 100 mL IVPB        2 g 200 mL/hr over 30 Minutes Intravenous  Once 05/05/20 0910 05/05/20 0956   05/05/20 0915  azithromycin (ZITHROMAX) 500 mg in sodium chloride 0.9 % 250 mL IVPB  Status:  Discontinued        500 mg 250 mL/hr over 60 Minutes Intravenous  Once 05/05/20 0910 05/05/20 0938          Objective: Vitals:   05/05/20 2315 05/05/20 2320 05/06/20 0405 05/06/20 0745  BP:  122/83 130/87 128/81  Pulse:  (!) 102 98 (!) 111  Resp: 20 20 20  (!) 22  Temp:  98.7 F (37.1 C) 98.1 F (36.7 C) 98.8 F (37.1 C)  TempSrc:  Oral Oral   SpO2:  99% 100% 98%  Weight:      Height:        Intake/Output Summary (Last 24 hours) at 05/06/2020 1902 Last data filed at 05/06/2020 1822 Gross per 24 hour  Intake 3580.38  ml  Output 400 ml  Net 3180.38 ml   Filed Weights   05/05/20 0636  Weight: 61.7 kg    Examination:  General exam: calm, NAD Respiratory system: diminished, no wheezing. Respiratory effort normal. Cardiovascular system: S1 & S2 heard, tachycardia, No pedal edema. Gastrointestinal system: Abdomen is nondistended, soft and nontender. No organomegaly or masses felt. Normal bowel sounds heard. Central nervous system: Alert and oriented. No focal neurological deficits. Extremities: Symmetric 5 x 5 power. Skin: No rashes, lesions or ulcers Psychiatry: Judgement and insight appear normal. Mood & affect appropriate.     Data Reviewed: I have personally reviewed following labs and imaging studies  CBC: Recent Labs  Lab 05/05/20 0646 05/06/20 0413  WBC 22.6* 13.1*  HGB 13.8 13.3  HCT 40.2 38.7*  MCV 83.2 83.8  PLT 263 224    Basic Metabolic Panel: Recent Labs  Lab 05/05/20 0646 05/06/20 0413  NA 136 139  K 3.8 3.8  CL 104 108  CO2 21* 23  GLUCOSE 134* 101*  BUN 13 10  CREATININE 0.92 1.00  CALCIUM 9.3 8.3*    GFR: Estimated Creatinine Clearance: 81.4 mL/min (by C-G formula based on SCr of 1 mg/dL).  Liver Function Tests: Recent Labs  Lab 05/05/20 0646  AST 29  ALT 52*  ALKPHOS 68  BILITOT 1.9*  PROT 8.0  ALBUMIN 4.2    CBG: Recent Labs  Lab 05/05/20 2218  GLUCAP 121*     Recent Results (from the past 240 hour(s))  SARS CORONAVIRUS 2 (TAT 6-24 HRS) Nasopharyngeal Nasopharyngeal Swab     Status: None   Collection Time: 05/05/20  6:54 AM   Specimen: Nasopharyngeal Swab  Result Value Ref Range Status   SARS Coronavirus 2 NEGATIVE NEGATIVE Final    Comment: (NOTE) SARS-CoV-2 target nucleic acids are NOT DETECTED.  The SARS-CoV-2 RNA is generally detectable in upper and lower respiratory specimens during the acute phase of infection. Negative results do not preclude SARS-CoV-2 infection, do not rule out co-infections with other pathogens, and  should not be used as the sole basis for treatment or  other patient management decisions. Negative results must be combined with clinical observations, patient history, and epidemiological information. The expected result is Negative.  Fact Sheet for Patients: HairSlick.no  Fact Sheet for Healthcare Providers: quierodirigir.com  This test is not yet approved or cleared by the [place] FDA and  has been authorized for detection and/or diagnosis of SARS-CoV-2 by FDA under an Emergency Use Authorization (EUA). This EUA will remain  in effect (meaning this test can be used) for the duration of the COVID-19 declaration under Se ction 564(b)(1) of the Act, 21 U.S.C. section 360bbb-3(b)(1), unless the authorization is terminated or revoked sooner.  Performed at [facility], [address] [place], [place] [address]   Culture, blood (routine x 2)     Status: None (Preliminary result)   Collection Time: 05/05/20  9:14 AM   Specimen: BLOOD  Result Value Ref Range Status   Specimen Description BLOOD BLOOD RIGHT FOREARM  Final   Special Requests   Final    BOTTLES DRAWN AEROBIC AND ANAEROBIC Blood Culture adequate volume   Culture   Final    NO GROWTH < 24 HOURS Performed at [facility], [address], [place], [place] [address]    Report Status PENDING  Incomplete  Culture, blood (routine x 2)     Status: None (Preliminary result)   Collection Time: 05/05/20  9:16 AM   Specimen: BLOOD  Result Value Ref Range Status   Specimen Description BLOOD LEFT ANTECUBITAL  Final   Special Requests   Final    BOTTLES DRAWN AEROBIC AND ANAEROBIC Blood Culture adequate volume   Culture   Final    NO GROWTH < 24 HOURS Performed at [facility], [address] [place], [place] [address]    Report Status PENDING  Incomplete         Radiology Studies: CT Angio Chest PE W and/or Wo Contrast  Result Date:  05/05/2020 CLINICAL DATA:  Chest pain. EXAM: CT ANGIOGRAPHY CHEST WITH CONTRAST TECHNIQUE: Multidetector CT imaging of the chest was performed using the standard protocol during bolus administration of intravenous contrast. Multiplanar CT image reconstructions and MIPs were obtained to evaluate the vascular anatomy. CONTRAST:  25mL OMNIPAQUE IOHEXOL 350 MG/ML SOLN COMPARISON:  None. FINDINGS: Cardiovascular: Satisfactory opacification of the pulmonary arteries to the segmental level. No evidence of pulmonary embolism. Normal heart size. No pericardial effusion. Mediastinum/Nodes: No enlarged mediastinal, hilar, or axillary lymph nodes. Thyroid gland, trachea, and esophagus demonstrate no significant findings. Lungs/Pleura: No pneumothorax is noted. Small right pleural effusion is noted with adjacent right lower lobe pneumonia or atelectasis. Mild subsegmental atelectasis is noted in both upper lobes and the left lower lobe. Upper Abdomen: Hepatic steatosis. Musculoskeletal: No chest wall abnormality. No acute or significant osseous findings. Review of the MIP images confirms the above findings. IMPRESSION: No definite evidence of pulmonary embolus. Small right pleural effusion is noted with adjacent right lower lobe pneumonia or atelectasis. Subsegmental atelectasis is noted in both upper lobes and the left lower lobe. Hepatic steatosis. Electronically Signed   By: Lupita Raider M.D.   On: 05/05/2020 08:25   CT ABDOMEN PELVIS W CONTRAST  Result Date: 05/06/2020 CLINICAL DATA:  Left lower quadrant abdominal pain after hernia repair 3 weeks ago. EXAM: CT ABDOMEN AND PELVIS WITH CONTRAST TECHNIQUE: Multidetector CT imaging of the abdomen and pelvis was performed using the standard protocol following bolus administration of intravenous contrast. CONTRAST:  OMNIPAQUE IOHEXOL 300 MG/ML  SOLN COMPARISON:  None. FINDINGS: Lower chest: Moderate right pleural effusion is noted with associated atelectasis of the  right lower lobe. Small left pleural effusion is noted with adjacent atelectasis of the left lower lobe. Hepatobiliary: No gallstones or biliary dilatation is noted. Probable hepatic steatosis. Pancreas: Unremarkable. No pancreatic ductal dilatation or surrounding inflammatory changes. Spleen: Normal in size without focal abnormality. Adrenals/Urinary Tract: Adrenal glands are unremarkable. Kidneys are normal, without renal calculi, focal lesion, or hydronephrosis. Bladder is unremarkable. Stomach/Bowel: Stomach is within normal limits. Appendix appears normal. No evidence of bowel wall thickening, distention, or inflammatory changes. Vascular/Lymphatic: No significant vascular findings are present. No enlarged abdominal or pelvic lymph nodes. Reproductive: Prostate is unremarkable. Other: No ascites is noted. Postsurgical changes are noted in the left inguinal region consistent with history of recent hernia repair. Musculoskeletal: No acute or significant osseous findings. IMPRESSION: Moderate size right pleural effusion is noted with adjacent atelectasis of the right lower lobe. Small left pleural effusion is noted with adjacent subsegmental atelectasis of the left lower lobe. Probable hepatic steatosis. Postsurgical changes are noted in the left inguinal region consistent with recent hernia repair. Electronically Signed   By: Lupita Raider M.D.   On: 05/06/2020 11:39   DG Chest Port 1 View  Result Date: 05/05/2020 CLINICAL DATA:  Shortness of breath.  Pleuritic right chest pain. EXAM: PORTABLE CHEST 1 VIEW COMPARISON:  No prior. FINDINGS: Mediastinum hilar structures normal. Heart size normal. Low lung volumes with bibasilar atelectasis/infiltrates. No pleural effusion or pneumothorax. IMPRESSION: Low lung volumes with bibasilar atelectasis/infiltrates. Electronically Signed   By: Maisie Fus  Register   On: 05/05/2020 07:06        Scheduled Meds: . enoxaparin (LOVENOX) injection  40 mg Subcutaneous Q24H   . guaiFENesin  600 mg Oral BID  . ipratropium-albuterol  3 mL Nebulization Q6H   Continuous Infusions: . sodium chloride 125 mL/hr at 05/06/20 1822  . azithromycin Stopped (05/06/20 1106)  . cefTRIAXone (ROCEPHIN)  IV Stopped (05/06/20 1245)     LOS: 1 day   Time spent: Greater than 50% of this time was spent in counseling, explanation of diagnosis, planning of further management, and coordination of care.   Voice Recognition Reubin Milan dictation system was used to create this note, attempts have been made to correct errors. Please contact the author with questions and/or clarifications.   Albertine Grates, MD PhD FACP [facility]  Available via Epic secure chat 7am-7pm for nonurgent issues Please page for urgent issues To page the attending provider between 7A-7P or the covering provider during after hours 7P-7A, please log into the web site [email or web address] and access using universal [place] password for that web site. If you do not have the password, please call the hospital operator.    05/06/2020, 7:02 PM

## 2020-05-06 NOTE — Progress Notes (Signed)
Cross coverage note  Rapid response called at around 8:30 PM for tachycardia and acute hypoxia and red MEWS  Brief Narrative:  Joel Vasquezis a 40 y.o.malewith medical history significant forrecent inguinal hernia repair in the [place] admitted on 4/28 with right lower lobe pneumonia, and acute hypoxic respiratory failure.  CTA negative for PE on 4/28.   Vitals: Temperature 102.8, BP 137/79 with MAP of 95, heart rate 121, respiratory 31 with O2 sat 94% on 3 L  Assessment/plan: RLL pneumonia Acute hypoxic respiratory failure Sepsis  - We will give a one-time IV fluid 1 L bolus - Repeat labs/repeat "sepsis including lactic acid to evaluate for worsening -No additional recommendations at this time based on review.  Management appears appropriate in current red MEWS likely in keeping with nonlinear improvement for sepsis - CTA chest already negative for PE - Blood culture showing no growth - COVID-19 and Legionella negative - Continue Rocephin and azithromycin - Continue IV fluids

## 2020-05-06 NOTE — Progress Notes (Signed)
RT at bedside with patient and rapid response was called. Pt is saturating normal and doesn't appear to be in any distress. Pt breathing treatment will be put on hold due to high heart rate. AC and ICU nurse at bedside and aware.

## 2020-05-06 NOTE — Progress Notes (Signed)
05/05/20 2224 05/05/20 2230 05/05/20 2235  Assess: MEWS Score  Temp (!) 100.5 F (38.1 C)  --   --   BP (!) 158/96 (!) 141/98  --   Pulse Rate (!) 114 (!) 117  --   ECG Heart Rate  --  (!) 117  --   Resp 18 (!) 36  --   SpO2 98 %  --  99 %  O2 Device Nasal Cannula  --  Nasal Cannula  O2 Flow Rate (L/min) 4 L/min  --  4 L/min  Assess: MEWS Score  MEWS Temp 1 1  --   MEWS Systolic 0 0  --   MEWS Pulse 2 2  --   MEWS RR 0 3  --   MEWS LOC 0 0  --   MEWS Score 3 6  --   MEWS Score Color Yellow Red  --   Assess: if the MEWS score is Yellow or Red  Were vital signs taken at a resting state?  --  Yes  --   Focused Assessment  --  Change from prior assessment (see assessment flowsheet)  --   Early Detection of Sepsis Score *See Row Information*  --  Low  --   MEWS guidelines implemented *See Row Information*  --  No, previously red, continue vital signs every 4 hours  --   Treat  Pain Scale  --  0-10  --   Pain Score  --  8  --   Pain Type  --  Acute pain  --   Pain Location  --  Back  --   Pain Orientation  --  Right  --   Pain Descriptors / Indicators  --  Sharp  --   Pain Frequency  --  Constant  --   Pain Onset  --  On-going  --   Patients Stated Pain Goal  --  0  --   Pain Intervention(s)  --  Medication (See eMAR)  --   Notify: Charge Nurse/RN  Name of Charge Nurse/RN Notified Kaira, RN  --   --   Date Charge Nurse/RN Notified 05/05/20  --   --   Time Charge Nurse/RN Notified 2215  --   --   Notify: Provider  Provider Name/Title Opyd, MD  --   --   Date Provider Notified 05/05/20  --   --   Time Provider Notified 2219  --   --   Notification Type Page (+ Call)  --   --   Notification Reason Change in status  --   --   Provider response En route  --   --   Date of Provider Response 05/05/20  --   --   Time of Provider Response 2220  --   --   Notify: Rapid Response  Date Rapid Response Notified 05/05/20  --   --   Time Rapid Response Notified 2215  --   --    Document  Patient Outcome  --   --   --     05/05/20 2240 05/05/20 2315 05/05/20 2320  Assess: MEWS Score  Temp  --   --  98.7 F (37.1 C)  BP (!) 131/94  --  122/83  Pulse Rate (!) 113  --  (!) 102  ECG Heart Rate (!) 113 (!) 103  --   Resp (!) 38 20 20  SpO2  --   --  99 %  O2 Device  --   --  Nasal Cannula  O2 Flow Rate (L/min)  --   --   --   Assess: MEWS Score  MEWS Temp 1 1 0  MEWS Systolic 0 0 0  MEWS Pulse 2 1 1   MEWS RR 3 0 0  MEWS LOC 0 0 0  MEWS Score 6 2 1   MEWS Score Color Red Yellow Green  Assess: if the MEWS score is Yellow or Red  Were vital signs taken at a resting state?  --   --   --   Focused Assessment  --   --   --   Early Detection of Sepsis Score *See Row Information*  --   --   --   MEWS guidelines implemented *See Row Information*  --   --   --   Treat  Pain Scale  --   --   --   Pain Score  --   --   --   Pain Type  --   --   --   Pain Location  --   --   --   Pain Orientation  --   --   --   Pain Descriptors / Indicators  --   --   --   Pain Frequency  --   --   --   Pain Onset  --   --   --   Patients Stated Pain Goal  --   --   --   Pain Intervention(s)  --   --   --   Notify: Charge Nurse/RN  Name of Charge Nurse/RN Notified  --   --   --   Date Charge Nurse/RN Notified  --   --   --   Time Charge Nurse/RN Notified  --   --   --   Notify: Provider  Provider Name/Title  --   --   --   Date Provider Notified  --   --   --   Time Provider Notified  --   --   --   Notification Type  --   --   --   Notification Reason  --   --   --   Provider response  --   --   --   Date of Provider Response  --   --   --   Time of Provider Response  --   --   --   Notify: Rapid Response  Date Rapid Response Notified  --   --   --   Time Rapid Response Notified  --   --   --   Document  Patient Outcome  --   --   --     05/06/20 0405  Assess: MEWS Score  Temp 98.1 F (36.7 C)  BP 130/87  Pulse Rate 98  ECG Heart Rate  --   Resp 20  SpO2 100  %  O2 Device Nasal Cannula  O2 Flow Rate (L/min)  --   Assess: MEWS Score  MEWS Temp 0  MEWS Systolic 0  MEWS Pulse 0  MEWS RR 0  MEWS LOC 0  MEWS Score 0  MEWS Score Color Green  Assess: if the MEWS score is Yellow or Red  Were vital signs taken at a resting state?  --   Focused Assessment  --   Early Detection of Sepsis Score *See Row Information*  --   MEWS guidelines implemented *See Row Information*  --   Treat  Pain Scale  --  Pain Score  --   Pain Type  --   Pain Location  --   Pain Orientation  --   Pain Descriptors / Indicators  --   Pain Frequency  --   Pain Onset  --   Patients Stated Pain Goal  --   Pain Intervention(s)  --   Notify: Charge Nurse/RN  Name of Charge Nurse/RN Notified  --   Date Charge Nurse/RN Notified  --   Time Charge Nurse/RN Notified  --   Notify: Provider  Provider Name/Title  --   Date Provider Notified  --   Time Provider Notified  --   Notification Type  --   Notification Reason  --   Provider response  --   Date of Provider Response  --   Time of Provider Response  --   Notify: Rapid Response  Date Rapid Response Notified  --   Time Rapid Response Notified  --   Document  Patient Outcome Stabilized after interventions

## 2020-05-07 ENCOUNTER — Inpatient Hospital Stay: Payer: Self-pay

## 2020-05-07 ENCOUNTER — Encounter: Payer: Self-pay | Admitting: Internal Medicine

## 2020-05-07 LAB — BODY FLUID CELL COUNT WITH DIFFERENTIAL
Eos, Fluid: 1 %
Lymphs, Fluid: 23 %
Monocyte-Macrophage-Serous Fluid: 10 %
Neutrophil Count, Fluid: 64 %
Other Cells, Fluid: 2 %
Total Nucleated Cell Count, Fluid: 268 cu mm

## 2020-05-07 LAB — PROTEIN, PLEURAL OR PERITONEAL FLUID: Total protein, fluid: 3.4 g/dL

## 2020-05-07 LAB — COMPREHENSIVE METABOLIC PANEL
ALT: 30 U/L (ref 0–44)
AST: 22 U/L (ref 15–41)
Albumin: 3 g/dL — ABNORMAL LOW (ref 3.5–5.0)
Alkaline Phosphatase: 61 U/L (ref 38–126)
Anion gap: 6 (ref 5–15)
BUN: 9 mg/dL (ref 6–20)
CO2: 22 mmol/L (ref 22–32)
Calcium: 8.3 mg/dL — ABNORMAL LOW (ref 8.9–10.3)
Chloride: 111 mmol/L (ref 98–111)
Creatinine, Ser: 0.83 mg/dL (ref 0.61–1.24)
GFR, Estimated: >60 mL/min (ref >60)
Glucose, Bld: 141 mg/dL — ABNORMAL HIGH (ref 70–99)
Potassium: 3.7 mmol/L (ref 3.5–5.1)
Sodium: 139 mmol/L (ref 135–145)
Total Bilirubin: 0.6 mg/dL (ref 0.3–1.2)
Total Protein: 6.4 g/dL — ABNORMAL LOW (ref 6.5–8.1)

## 2020-05-07 LAB — CBC WITH DIFFERENTIAL/PLATELET
Abs Immature Granulocytes: 0.07 10*3/uL (ref 0.00–0.07)
Basophils Absolute: 0.1 10*3/uL (ref 0.0–0.1)
Basophils Relative: 0 %
Eosinophils Absolute: 0.1 10*3/uL (ref 0.0–0.5)
Eosinophils Relative: 1 %
HCT: 34.3 % — ABNORMAL LOW (ref 39.0–52.0)
Hemoglobin: 12 g/dL — ABNORMAL LOW (ref 13.0–17.0)
Immature Granulocytes: 1 %
Lymphocytes Relative: 8 %
Lymphs Abs: 1 10*3/uL (ref 0.7–4.0)
MCH: 28.9 pg (ref 26.0–34.0)
MCHC: 35 g/dL (ref 30.0–36.0)
MCV: 82.7 fL (ref 80.0–100.0)
Monocytes Absolute: 0.8 10*3/uL (ref 0.1–1.0)
Monocytes Relative: 6 %
Neutro Abs: 11.4 10*3/uL — ABNORMAL HIGH (ref 1.7–7.7)
Neutrophils Relative %: 84 %
Platelets: 232 10*3/uL (ref 150–400)
RBC: 4.15 MIL/uL — ABNORMAL LOW (ref 4.22–5.81)
RDW: 12.9 % (ref 11.5–15.5)
WBC: 13.5 10*3/uL — ABNORMAL HIGH (ref 4.0–10.5)
nRBC: 0 % (ref 0.0–0.2)

## 2020-05-07 LAB — LACTATE DEHYDROGENASE, PLEURAL OR PERITONEAL FLUID: LD, Fluid: 106 U/L — ABNORMAL HIGH (ref 3–23)

## 2020-05-07 LAB — URINE CULTURE: Culture: NO GROWTH

## 2020-05-07 LAB — MAGNESIUM: Magnesium: 2 mg/dL (ref 1.7–2.4)

## 2020-05-07 LAB — LEGIONELLA PNEUMOPHILA SEROGP 1 UR AG: L. pneumophila Serogp 1 Ur Ag: NEGATIVE

## 2020-05-07 MED ORDER — IOHEXOL 300 MG/ML  SOLN
75.0000 mL | Freq: Once | INTRAMUSCULAR | Status: AC | PRN
  Administered 2020-05-07: 75 mL via INTRAVENOUS

## 2020-05-07 NOTE — Procedures (Signed)
PROCEDURE SUMMARY:  Successful US guided right thoracentesis. Yielded 300 mL of slightly hazy amber fluid. Pt tolerated procedure well. No immediate complications.  Specimen was sent for labs. CXR ordered.  EBL < 5 mL  Brayton El PA-C 05/07/2020 11:59 AM

## 2020-05-07 NOTE — Progress Notes (Signed)
PROGRESS NOTE    Joel Joseph  NWG:[id number] DOB: 1980-01-18 DOA: 05/05/2020 PCP: Pcp, No    Chief Complaint  Patient presents with  . Shortness of Breath    Brief Narrative:  Joel Joseph is a 40 y.o. male with medical history significant for recent inguinal hernia repair in the [place] who presents to the ER via private vehicle for evaluation of pain mostly in the right lower lateral chest wall associated with shortness of breath, fever and a cough. CTA negative for PE, found right lower lobe pneumonia  Subjective:  Rapid response called last night due to patient continues spiking fever, persistent tachycardia, hypoxia,  Received 1 L normal saline bolus last night  Assessment & Plan:   Active Problems:   CAP (community acquired pneumonia)  Right lower lobe pneumonia/right pleural effusion /acute hypoxic respiratory failure/sepsis present on admission -Fever 102.6, tachypneic respiration rate ranged from 21-38 on presentation, tachycardia heart rate 127, lactic acidosis, lactic acid 2.1, leukocytosis, WBC 22.6 -CTA chest negative for PE,  -Blood culture no growth COVID-19 screening negative -urine Legionella and urine strep pneumo antigen negative -Due to persistent symptom, repeat ct chest showed "Moderate-sized right pleural effusion with complete atelectasis/opacification of the right lower lobe. Mild patchy opacity in the left lower lobe may be due to atelectasis or additional focus of pneumonia." - IR consulted for right thoracentesis with fluid study , cytology and culture -Continue Rocephin and Zithromax, follow culture result, check mrsa    The patient's BMI is: Body mass index is 23.34 kg/m.Marland Kitchen     Unresulted Labs (From admission, onward)          Start     Ordered   05/08/20 0500  Procalcitonin  Daily,   R      05/07/20 0934   05/07/20 1206  PH, Body Fluid  RELEASE UPON ORDERING,   TIMED        05/07/20 1206   05/07/20 1206  Body fluid  culture w Gram Stain  RELEASE UPON ORDERING,   TIMED        05/07/20 1206   05/06/20 1224  Expectorated Sputum Assessment w Gram Stain, Rflx to Resp Cult  Once,   R        05/06/20 1223            DVT prophylaxis: scd's    Code Status:full Family Communication: Family at bedside Disposition:   Status is: Inpatient   Dispo: The patient is from: Home              Anticipated d/c is to: Home              Anticipated d/c date is: To be determined, not medically stable to discharge                Consultants:   IR  Procedures:   Thoracentesis on 4/30  Antimicrobials:   Anti-infectives (From admission, onward)   Start     Dose/Rate Route Frequency Ordered Stop   05/06/20 1000  cefTRIAXone (ROCEPHIN) 2 g in sodium chloride 0.9 % 100 mL IVPB        2 g 200 mL/hr over 30 Minutes Intravenous Every 24 hours 05/05/20 0955 05/10/20 0959   05/05/20 0930  cefTRIAXone (ROCEPHIN) 2 g in sodium chloride 0.9 % 100 mL IVPB  Status:  Discontinued        2 g 200 mL/hr over 30 Minutes Intravenous Every 24 hours 05/05/20 0925 05/05/20 0955   05/05/20 0930  azithromycin (ZITHROMAX) 500 mg in sodium chloride 0.9 % 250 mL IVPB        500 mg 250 mL/hr over 60 Minutes Intravenous Every 24 hours 05/05/20 0925 05/10/20 0929   05/05/20 0915  cefTRIAXone (ROCEPHIN) 2 g in sodium chloride 0.9 % 100 mL IVPB        2 g 200 mL/hr over 30 Minutes Intravenous  Once 05/05/20 0910 05/05/20 0956   05/05/20 0915  azithromycin (ZITHROMAX) 500 mg in sodium chloride 0.9 % 250 mL IVPB  Status:  Discontinued        500 mg 250 mL/hr over 60 Minutes Intravenous  Once 05/05/20 0910 05/05/20 0938          Objective: Vitals:   05/07/20 1207 05/07/20 1320 05/07/20 1415 05/07/20 1550  BP: (!) 152/101 (!) 141/101  (!) 141/88  Pulse: 98 (!) 101  (!) 106  Resp:    18  Temp:  98.7 F (37.1 C)  98.3 F (36.8 C)  TempSrc:  Oral    SpO2: 96% 93% 92% 94%  Weight:      Height:        Intake/Output Summary  (Last 24 hours) at 05/07/2020 1729 Last data filed at 05/06/2020 1822 Gross per 24 hour  Intake 426.91 ml  Output --  Net 426.91 ml   Filed Weights   05/05/20 0636  Weight: 61.7 kg    Examination:  General exam: calm, NAD Respiratory system: diminished, no wheezing. Respiratory effort normal. Cardiovascular system: S1 & S2 heard, tachycardia, No pedal edema. Gastrointestinal system: Abdomen is nondistended, soft and nontender. No organomegaly or masses felt. Normal bowel sounds heard. Central nervous system: Alert and oriented. No focal neurological deficits. Extremities: Symmetric 5 x 5 power. Skin: No rashes, lesions or ulcers Psychiatry: Judgement and insight appear normal. Mood & affect appropriate.     Data Reviewed: I have personally reviewed following labs and imaging studies  CBC: Recent Labs  Lab 05/05/20 0646 05/06/20 0413 05/06/20 2123 05/07/20 0509  WBC 22.6* 13.1* 13.4* 13.5*  NEUTROABS  --   --  11.8* 11.4*  HGB 13.8 13.3 12.0* 12.0*  HCT 40.2 38.7* 34.8* 34.3*  MCV 83.2 83.8 82.3 82.7  PLT 263 224 235 232    Basic Metabolic Panel: Recent Labs  Lab 05/05/20 0646 05/06/20 0413 05/06/20 2123 05/07/20 0509  NA 136 139 136 139  K 3.8 3.8 3.3* 3.7  CL 104 108 107 111  CO2 21* 23 21* 22  GLUCOSE 134* 101* 160* 141*  BUN 13 10 10 9   CREATININE 0.92 1.00 0.94 0.83  CALCIUM 9.3 8.3* 8.4* 8.3*  MG  --   --   --  2.0    GFR: Estimated Creatinine Clearance: 98.1 mL/min (by C-G formula based on SCr of 0.83 mg/dL).  Liver Function Tests: Recent Labs  Lab 05/05/20 0646 05/06/20 2123 05/07/20 0509  AST 29 21 22   ALT 52* 31 30  ALKPHOS 68 61 61  BILITOT 1.9* 0.9 0.6  PROT 8.0 6.6 6.4*  ALBUMIN 4.2 3.2* 3.0*    CBG: Recent Labs  Lab 05/05/20 2218  GLUCAP 121*     Recent Results (from the past 240 hour(s))  SARS CORONAVIRUS 2 (TAT 6-24 HRS) Nasopharyngeal Nasopharyngeal Swab     Status: None   Collection Time: 05/05/20  6:54 AM    Specimen: Nasopharyngeal Swab  Result Value Ref Range Status   SARS Coronavirus 2 NEGATIVE NEGATIVE Final    Comment: (NOTE) SARS-CoV-2 target nucleic  acids are NOT DETECTED.  The SARS-CoV-2 RNA is generally detectable in upper and lower respiratory specimens during the acute phase of infection. Negative results do not preclude SARS-CoV-2 infection, do not rule out co-infections with other pathogens, and should not be used as the sole basis for treatment or other patient management decisions. Negative results must be combined with clinical observations, patient history, and epidemiological information. The expected result is Negative.  Fact Sheet for Patients: HairSlick.no  Fact Sheet for Healthcare Providers: quierodirigir.com  This test is not yet approved or cleared by the [place] FDA and  has been authorized for detection and/or diagnosis of SARS-CoV-2 by FDA under an Emergency Use Authorization (EUA). This EUA will remain  in effect (meaning this test can be used) for the duration of the COVID-19 declaration under Se ction 564(b)(1) of the Act, 21 U.S.C. section 360bbb-3(b)(1), unless the authorization is terminated or revoked sooner.  Performed at [facility], [address] [place], [place] [address]   Culture, blood (routine x 2)     Status: None (Preliminary result)   Collection Time: 05/05/20  9:14 AM   Specimen: BLOOD  Result Value Ref Range Status   Specimen Description BLOOD BLOOD RIGHT FOREARM  Final   Special Requests   Final    BOTTLES DRAWN AEROBIC AND ANAEROBIC Blood Culture adequate volume   Culture   Final    NO GROWTH 2 DAYS Performed at [facility], [address], [place], [place] [address]    Report Status PENDING  Incomplete  Culture, blood (routine x 2)     Status: None (Preliminary result)   Collection Time: 05/05/20  9:16 AM   Specimen: BLOOD  Result Value Ref Range  Status   Specimen Description BLOOD LEFT ANTECUBITAL  Final   Special Requests   Final    BOTTLES DRAWN AEROBIC AND ANAEROBIC Blood Culture adequate volume   Culture   Final    NO GROWTH 2 DAYS Performed at [facility], [address] [place], [place] [address]    Report Status PENDING  Incomplete  Urine Culture     Status: None   Collection Time: 05/05/20  9:36 PM   Specimen: Urine, Random  Result Value Ref Range Status   Specimen Description   Final    URINE, RANDOM Performed at [facility], [address] [place], [place] [address]    Special Requests   Final    NONE Performed at [facility], [address], [place], [place] [address]    Culture   Final    NO GROWTH Performed at [facility], [address], [place], [place] [address]    Report Status 05/07/2020 FINAL  Final         Radiology Studies: DG Chest 1 View  Result Date: 05/07/2020 CLINICAL DATA:  Status post thoracentesis EXAM: CHEST  1 VIEW COMPARISON:  CT chest 05/07/2020 FINDINGS: Small right pleural effusion. Right basilar atelectasis. Left basilar atelectasis. No pneumothorax. Stable cardiomediastinal silhouette. No aggressive osseous lesion. IMPRESSION: No pneumothorax status post right thoracentesis. Small residual right pleural effusion with atelectasis. Electronically Signed   By: Elige Ko   On: 05/07/2020 12:46   DG Chest 2 View  Result Date: 05/07/2020 CLINICAL DATA:  Re-evaluate right pleural effusion. EXAM: CHEST - 2 VIEW COMPARISON:  Chest radiograph 03/07/2020 FINDINGS: There has been interval increase of bibasilar opacities. A small effusion present on the right. There is increasing left basilar opacity  which is likely due to airspace disease rather than effusion. IMPRESSION: Interval worsening of bibasilar airspace opacities suspicious for progressive pneumonia. Small right pleural effusion is seen. Electronically Signed   By: Acquanetta BellingFarhaan  Mir M.D.   On:  05/07/2020 09:33   CT CHEST W CONTRAST  Result Date: 05/07/2020 CLINICAL DATA:  40 year old gentleman with pneumonia and persistent fevers. EXAM: CT CHEST WITH CONTRAST TECHNIQUE: Multidetector CT imaging of the chest was performed during intravenous contrast administration. CONTRAST:  75mL OMNIPAQUE IOHEXOL 300 MG/ML  SOLN COMPARISON:  CT chest 05/05/2020 FINDINGS: Cardiovascular: No significant vascular findings. Normal heart size. No pericardial effusion. Mediastinum/Nodes: No enlarged mediastinal, hilar, or axillary lymph nodes. Thyroid gland, trachea, and esophagus demonstrate no significant findings. Lungs/Pleura: Moderate right and trace left pleural effusion. There is complete atelectasis of the right lower lobe. Upper Abdomen: Diffuse hepatic steatosis. Musculoskeletal: No chest wall abnormality. No acute or significant osseous findings. IMPRESSION: Moderate-sized right pleural effusion with complete atelectasis/opacification of the right lower lobe. Mild patchy opacity in the left lower lobe may be due to atelectasis or additional focus of pneumonia. Electronically Signed   By: Acquanetta BellingFarhaan  Mir M.D.   On: 05/07/2020 10:46   CT ABDOMEN PELVIS W CONTRAST  Result Date: 05/06/2020 CLINICAL DATA:  Left lower quadrant abdominal pain after hernia repair 3 weeks ago. EXAM: CT ABDOMEN AND PELVIS WITH CONTRAST TECHNIQUE: Multidetector CT imaging of the abdomen and pelvis was performed using the standard protocol following bolus administration of intravenous contrast. CONTRAST:  100mL OMNIPAQUE IOHEXOL 300 MG/ML  SOLN COMPARISON:  None. FINDINGS: Lower chest: Moderate right pleural effusion is noted with associated atelectasis of the right lower lobe. Small left pleural effusion is noted with adjacent atelectasis of the left lower lobe. Hepatobiliary: No gallstones or biliary dilatation is noted. Probable hepatic steatosis. Pancreas: Unremarkable. No pancreatic ductal dilatation or surrounding inflammatory  changes. Spleen: Normal in size without focal abnormality. Adrenals/Urinary Tract: Adrenal glands are unremarkable. Kidneys are normal, without renal calculi, focal lesion, or hydronephrosis. Bladder is unremarkable. Stomach/Bowel: Stomach is within normal limits. Appendix appears normal. No evidence of bowel wall thickening, distention, or inflammatory changes. Vascular/Lymphatic: No significant vascular findings are present. No enlarged abdominal or pelvic lymph nodes. Reproductive: Prostate is unremarkable. Other: No ascites is noted. Postsurgical changes are noted in the left inguinal region consistent with history of recent hernia repair. Musculoskeletal: No acute or significant osseous findings. IMPRESSION: Moderate size right pleural effusion is noted with adjacent atelectasis of the right lower lobe. Small left pleural effusion is noted with adjacent subsegmental atelectasis of the left lower lobe. Probable hepatic steatosis. Postsurgical changes are noted in the left inguinal region consistent with recent hernia repair. Electronically Signed   By: Lupita RaiderJames  Green Jr M.D.   On: 05/06/2020 11:39   US THORACENTESIS ASP PLEURAL SPACE W/IMG GUIDE  Result Date: 05/07/2020 INDICATION: Shortness of breath. Pneumonia. Right-sided pleural effusion. Request for diagnostic and therapeutic thoracentesis. EXAM: ULTRASOUND GUIDED RIGHT THORACENTESIS MEDICATIONS: None. COMPLICATIONS: None immediate. Postprocedural chest x-ray negative for pneumothorax. PROCEDURE: An ultrasound guided thoracentesis was thoroughly discussed with the patient and questions answered. The benefits, risks, alternatives and complications were also discussed. The patient understands and wishes to proceed with the procedure. Written consent was obtained. Ultrasound was performed to localize and mark an adequate pocket of fluid in the right chest. The area was then prepped and draped in the normal sterile fashion. 1% Lidocaine was used for local  anesthesia. Under ultrasound guidance a 6 Fr Safe-T-Centesis catheter was introduced.  Thoracentesis was performed. The catheter was removed and a dressing applied. FINDINGS: A total of approximately 300 mL of slightly hazy, amber pleural fluid was removed. Samples were sent to the laboratory as requested by the clinical team. IMPRESSION: Successful ultrasound guided right thoracentesis yielding 300 mL of pleural fluid. Read by: Brayton El PA-C Electronically Signed   By: Acquanetta Belling M.D.   On: 05/07/2020 12:54        Scheduled Meds: . guaiFENesin  600 mg Oral BID  . ipratropium-albuterol  3 mL Nebulization Q6H   Continuous Infusions: . sodium chloride 125 mL/hr at 05/07/20 0857  . azithromycin 500 mg (05/07/20 1024)  . cefTRIAXone (ROCEPHIN)  IV 2 g (05/07/20 1349)     LOS: 2 days   Time spent: Greater than 50% of this time was spent in counseling, explanation of diagnosis, planning of further management, and coordination of care.   Voice Recognition Reubin Milan dictation system was used to create this note, attempts have been made to correct errors. Please contact the author with questions and/or clarifications.   Albertine Grates, MD PhD FACP [facility]  Available via Epic secure chat 7am-7pm for nonurgent issues Please page for urgent issues To page the attending provider between 7A-7P or the covering provider during after hours 7P-7A, please log into the web site [email or web address] and access using universal [place] password for that web site. If you do not have the password, please call the hospital operator.    05/07/2020, 5:29 PM

## 2020-05-08 LAB — CBC WITH DIFFERENTIAL/PLATELET
Abs Immature Granulocytes: 0.02 10*3/uL (ref 0.00–0.07)
Basophils Absolute: 0 10*3/uL (ref 0.0–0.1)
Basophils Relative: 0 %
Eosinophils Absolute: 0.2 10*3/uL (ref 0.0–0.5)
Eosinophils Relative: 2 %
HCT: 33.1 % — ABNORMAL LOW (ref 39.0–52.0)
Hemoglobin: 11.3 g/dL — ABNORMAL LOW (ref 13.0–17.0)
Immature Granulocytes: 0 %
Lymphocytes Relative: 11 %
Lymphs Abs: 0.9 10*3/uL (ref 0.7–4.0)
MCH: 28.3 pg (ref 26.0–34.0)
MCHC: 34.1 g/dL (ref 30.0–36.0)
MCV: 82.8 fL (ref 80.0–100.0)
Monocytes Absolute: 0.6 10*3/uL (ref 0.1–1.0)
Monocytes Relative: 7 %
Neutro Abs: 7.2 10*3/uL (ref 1.7–7.7)
Neutrophils Relative %: 80 %
Platelets: 263 10*3/uL (ref 150–400)
RBC: 4 MIL/uL — ABNORMAL LOW (ref 4.22–5.81)
RDW: 12.8 % (ref 11.5–15.5)
WBC: 8.9 10*3/uL (ref 4.0–10.5)
nRBC: 0 % (ref 0.0–0.2)

## 2020-05-08 LAB — MRSA PCR SCREENING: MRSA by PCR: NEGATIVE

## 2020-05-08 LAB — PROCALCITONIN: Procalcitonin: 0.21 ng/mL

## 2020-05-08 MED ORDER — FAMOTIDINE IN NACL 20-0.9 MG/50ML-% IV SOLN
20.0000 mg | Freq: Two times a day (BID) | INTRAVENOUS | Status: DC
  Administered 2020-05-08 (×2): 20 mg via INTRAVENOUS
  Filled 2020-05-08 (×3): qty 50

## 2020-05-08 MED ORDER — DIPHENHYDRAMINE HCL 50 MG/ML IJ SOLN
12.5000 mg | Freq: Once | INTRAMUSCULAR | Status: AC
  Administered 2020-05-08: 12.5 mg via INTRAVENOUS
  Filled 2020-05-08: qty 1

## 2020-05-08 MED ORDER — BENZONATATE 100 MG PO CAPS
100.0000 mg | ORAL_CAPSULE | Freq: Three times a day (TID) | ORAL | Status: DC | PRN
  Administered 2020-05-08: 100 mg via ORAL
  Filled 2020-05-08: qty 1

## 2020-05-08 MED ORDER — METHYLPREDNISOLONE SODIUM SUCC 125 MG IJ SOLR
60.0000 mg | Freq: Every day | INTRAMUSCULAR | Status: DC
  Administered 2020-05-08: 60 mg via INTRAVENOUS
  Filled 2020-05-08: qty 2

## 2020-05-08 NOTE — TOC Initial Note (Signed)
Transition of Care [facility]) - Initial/Assessment Note    Patient Details  Name: Joel Joseph MRN: [id number] Date of Birth: 07/26/1980  Transition of Care [facility]) CM/SW Contact:    Liliana Cline, LCSW Phone Number: 05/08/2020, 10:01 AM  Clinical Narrative:                Patient is uninsured. CSW spoke with patient's wife to assess for resource needs. Patient lives at home with his wife and children. Patient goes to [facility] in [place] for PCP. [facility]. No issues obtaining medications. Patient drives himself to appointments. No TOC needs at this time.   Expected Discharge Plan: Home/Self Care Barriers to Discharge: Continued Medical Work up   Patient Goals and CMS Choice Patient states their goals for this hospitalization and ongoing recovery are:: to return home with wife CMS Medicare.gov Compare Post Acute Care list provided to:: Patient Choice offered to / list presented to : [facility]  Expected Discharge Plan and Services Expected Discharge Plan: Home/Self Care       Living arrangements for the past 2 months: Single Family Home                                      Prior Living Arrangements/Services Living arrangements for the past 2 months: Single Family Home Lives with:: Spouse,Minor Children Patient language and need for interpreter reviewed:: Yes Do you feel safe going back to the place where you live?: Yes      Need for Family Participation in Patient Care: Yes (Comment) Care giver support system in place?: Yes (comment)   Criminal Activity/Legal Involvement Pertinent to Current Situation/Hospitalization: No - Comment as needed  Activities of Daily Living Home Assistive Devices/Equipment: None ADL Screening (condition at time of admission) Patient's cognitive ability adequate to safely complete daily activities?: Yes Is the patient deaf or have difficulty hearing?: No Does the patient have difficulty seeing, even when wearing  glasses/contacts?: No Does the patient have difficulty concentrating, remembering, or making decisions?: No Patient able to express need for assistance with ADLs?: No Does the patient have difficulty dressing or bathing?: No Independently performs ADLs?: Yes (appropriate for developmental age) Does the patient have difficulty walking or climbing stairs?: No Weakness of Legs: None Weakness of Arms/Hands: None  Permission Sought/Granted Permission sought to share information with : Family Supports Permission granted to share information with : Yes, Verbal Permission Granted              Emotional Assessment       Orientation: : Oriented to Self,Oriented to Place,Oriented to  Time,Oriented to Situation Alcohol / Substance Use: Not Applicable Psych Involvement: No (comment)  Admission diagnosis:  CAP (community acquired pneumonia) [J18.9] Right-sided chest pain [R07.9] Community acquired pneumonia of right lower lobe of lung [J18.9] Sepsis without acute organ dysfunction, due to unspecified organism [facility]) [A41.9] Patient Active Problem List   Diagnosis Date Noted  . CAP (community acquired pneumonia) 05/05/2020   PCP:  Oneita Hurt, No Pharmacy:   [facility] [place], [place] - [address] [place] [address] Phone: [phone] Fax: [phone]     Social Determinants of Health (SDOH) Interventions    Readmission Risk Interventions No flowsheet data found.

## 2020-05-08 NOTE — Progress Notes (Signed)
PROGRESS NOTE    Joel Joseph  ZOX:[id number] DOB: 03/24/1980 DOA: 05/05/2020 PCP: Leslie Dalesano-Solis, Carlos, PA    Chief Complaint  Patient presents with  . Shortness of Breath    Brief Narrative:  Joel Joseph is a 40 y.o. male with medical history significant for recent inguinal hernia repair in the [place] who presents to the ER via private vehicle for evaluation of pain mostly in the right lower lateral chest wall associated with shortness of breath, fever and a cough. CTA negative for PE, found right lower lobe pneumonia  Subjective:  S/p thorocentesis yesterday, reports pleuritic chest pain has much improved, fever subsided, hypoxia resolved, he is now on room air, he is less tachycardic He reports lower lip swelling while getting zithromax, reports no prior history of abx allergy, he took amoxicillin without issue in the past   Assessment & Plan:   Active Problems:   CAP (community acquired pneumonia)  Lobar pneumonia (RLL)/right pleural effusion /acute hypoxic respiratory failure/sepsis present on admission -Fever 102.6, tachypneic respiration rate ranged from 21-38 on presentation, tachycardia heart rate 127, lactic acidosis, lactic acid 2.1, leukocytosis, WBC 22.6 -CTA chest negative for PE,  -Blood culture no growth -COVID-19 screening negative -urine Legionella and urine strep pneumo antigen negative -Due to persistent symptom, repeat ct chest obtained on 4/30  and showed "Moderate-sized right pleural effusion with complete atelectasis/opacification of the right lower lobe. Mild patchy opacity in the left lower lobe may be due to atelectasis or additional focus of pneumonia." - s/p right thoracentesis on 4/30 with 300cc fluids removed follow up on  cytology and culture result -Continue Rocephin, d/c zithro -improving, D/c hydration, d/c tele, increase activity, out of bed  Lower lip swelling, on 5/1 am Possible zithromax allergy D/c zithro (urine strep  pneumo antigen negative) Reports no issues with amoxicillin in the past, will continue rocephin for now Iv solumedrol/ pepcid/benadrayl  monitor    The patient's BMI is: Body mass index is 23.34 kg/m.Marland Kitchen.     Unresulted Labs (From admission, onward)          Start     Ordered   05/09/20 0500  Basic metabolic panel  Tomorrow morning,   R       Question:  Specimen collection method  Answer:  Lab=Lab collect   05/08/20 0947   05/09/20 0500  Lactic acid, plasma  Tomorrow morning,   R       Question:  Specimen collection method  Answer:  Lab=Lab collect   05/08/20 0947   05/09/20 0500  TSH  Tomorrow morning,   R       Question:  Specimen collection method  Answer:  Lab=Lab collect   05/08/20 1205   05/08/20 0500  Procalcitonin  Daily,   R      05/07/20 0934   05/07/20 1732  MRSA PCR Screening  Once,   R        05/07/20 1731   05/07/20 1206  PH, Body Fluid  RELEASE UPON ORDERING,   TIMED        05/07/20 1206   05/06/20 1224  Expectorated Sputum Assessment w Gram Stain, Rflx to Resp Cult  Once,   R        05/06/20 1223            DVT prophylaxis: scd's    Code Status:full Family Communication: wife at bedside daily Disposition:   Status is: Inpatient   Dispo: The patient is from: Home  Anticipated d/c is to: Home              Anticipated d/c date is: 24-48hrs, pending clinical improvement , monitor lip swelling, f/u on culture result                Consultants:   IR  Procedures:   Thoracentesis on 4/30  Antimicrobials:   Anti-infectives (From admission, onward)   Start     Dose/Rate Route Frequency Ordered Stop   05/06/20 1000  cefTRIAXone (ROCEPHIN) 2 g in sodium chloride 0.9 % 100 mL IVPB        2 g 200 mL/hr over 30 Minutes Intravenous Every 24 hours 05/05/20 0955 05/10/20 0959   05/05/20 0930  cefTRIAXone (ROCEPHIN) 2 g in sodium chloride 0.9 % 100 mL IVPB  Status:  Discontinued        2 g 200 mL/hr over 30 Minutes Intravenous Every 24  hours 05/05/20 0925 05/05/20 0955   05/05/20 0930  azithromycin (ZITHROMAX) 500 mg in sodium chloride 0.9 % 250 mL IVPB  Status:  Discontinued        500 mg 250 mL/hr over 60 Minutes Intravenous Every 24 hours 05/05/20 0925 05/08/20 1200   05/05/20 0915  cefTRIAXone (ROCEPHIN) 2 g in sodium chloride 0.9 % 100 mL IVPB        2 g 200 mL/hr over 30 Minutes Intravenous  Once 05/05/20 0910 05/05/20 0956   05/05/20 0915  azithromycin (ZITHROMAX) 500 mg in sodium chloride 0.9 % 250 mL IVPB  Status:  Discontinued        500 mg 250 mL/hr over 60 Minutes Intravenous  Once 05/05/20 0910 05/05/20 0938          Objective: Vitals:   05/08/20 0741 05/08/20 0913 05/08/20 0952 05/08/20 1111  BP: 139/84 130/89  134/87  Pulse: 97 (!) 104  94  Resp: 18 18  18   Temp: 98.1 F (36.7 C) 98.7 F (37.1 C)  98.6 F (37 C)  TempSrc: Oral Oral  Oral  SpO2: 97% 95% 95% 96%  Weight:      Height:        Intake/Output Summary (Last 24 hours) at 05/08/2020 1222 Last data filed at 05/08/2020 1026 Gross per 24 hour  Intake 240 ml  Output 300 ml  Net -60 ml   Filed Weights   05/05/20 0636  Weight: 61.7 kg    Examination:  General exam: calm, NAD, lower lip edema Respiratory system: diminished, no wheezing. Respiratory effort normal. Cardiovascular system: S1 & S2 heard, tachycardia, No pedal edema. Gastrointestinal system: Abdomen is nondistended, soft and nontender. No organomegaly or masses felt. Normal bowel sounds heard. Central nervous system: Alert and oriented. No focal neurological deficits. Extremities: Symmetric 5 x 5 power. Skin: No rashes, lesions or ulcers Psychiatry: Judgement and insight appear normal. Mood & affect appropriate.     Data Reviewed: I have personally reviewed following labs and imaging studies  CBC: Recent Labs  Lab 05/05/20 0646 05/06/20 0413 05/06/20 2123 05/07/20 0509 05/08/20 0527  WBC 22.6* 13.1* 13.4* 13.5* 8.9  NEUTROABS  --   --  11.8* 11.4* 7.2  HGB  13.8 13.3 12.0* 12.0* 11.3*  HCT 40.2 38.7* 34.8* 34.3* 33.1*  MCV 83.2 83.8 82.3 82.7 82.8  PLT 263 224 235 232 263    Basic Metabolic Panel: Recent Labs  Lab 05/05/20 0646 05/06/20 0413 05/06/20 2123 05/07/20 0509  NA 136 139 136 139  K 3.8 3.8 3.3* 3.7  CL 104  108 107 111  CO2 21* 23 21* 22  GLUCOSE 134* 101* 160* 141*  BUN CREATININE 0.92 1.00 0.94 0.83  CALCIUM 9.3 8.3* 8.4* 8.3*  MG  --   --   --  2.0    GFR: Estimated Creatinine Clearance: 98.1 mL/min (by C-G formula based on SCr of 0.83 mg/dL).  Liver Function Tests: Recent Labs  Lab 05/05/20 0646 05/06/20 2123 05/07/20 0509  AST ALT 52* 31 30  ALKPHOS 68 61 61  BILITOT 1.9* 0.9 0.6  PROT 8.0 6.6 6.4*  ALBUMIN 4.2 3.2* 3.0*    CBG: Recent Labs  Lab 05/05/20 2218  GLUCAP 121*     Recent Results (from the past 240 hour(s))  SARS CORONAVIRUS 2 (TAT 6-24 HRS) Nasopharyngeal Nasopharyngeal Swab     Status: None   Collection Time: 05/05/20  6:54 AM   Specimen: Nasopharyngeal Swab  Result Value Ref Range Status   SARS Coronavirus 2 NEGATIVE NEGATIVE Final    Comment: (NOTE) SARS-CoV-2 target nucleic acids are NOT DETECTED.  The SARS-CoV-2 RNA is generally detectable in upper and lower respiratory specimens during the acute phase of infection. Negative results do not preclude SARS-CoV-2 infection, do not rule out co-infections with other pathogens, and should not be used as the sole basis for treatment or other patient management decisions. Negative results must be combined with clinical observations, patient history, and epidemiological information. The expected result is Negative.  Fact Sheet for Patients: HairSlick.no  Fact Sheet for Healthcare Providers: quierodirigir.com  This test is not yet approved or cleared by the [place] FDA and  has been authorized for detection and/or diagnosis of SARS-CoV-2 by FDA  under an Emergency Use Authorization (EUA). This EUA will remain  in effect (meaning this test can be used) for the duration of the COVID-19 declaration under Se ction 564(b)(1) of the Act, 21 U.S.C. section 360bbb-3(b)(1), unless the authorization is terminated or revoked sooner.  Performed at [facility], [address] [place], [place] [address]   Culture, blood (routine x 2)     Status: None (Preliminary result)   Collection Time: 05/05/20  9:14 AM   Specimen: BLOOD  Result Value Ref Range Status   Specimen Description BLOOD BLOOD RIGHT FOREARM  Final   Special Requests   Final    BOTTLES DRAWN AEROBIC AND ANAEROBIC Blood Culture adequate volume   Culture   Final    NO GROWTH 3 DAYS Performed at [facility], [address], [place], [place] [address]    Report Status PENDING  Incomplete  Culture, blood (routine x 2)     Status: None (Preliminary result)   Collection Time: 05/05/20  9:16 AM   Specimen: BLOOD  Result Value Ref Range Status   Specimen Description BLOOD LEFT ANTECUBITAL  Final   Special Requests   Final    BOTTLES DRAWN AEROBIC AND ANAEROBIC Blood Culture adequate volume   Culture   Final    NO GROWTH 3 DAYS Performed at [facility], [address], [place], [place] [address]    Report Status PENDING  Incomplete  Urine Culture     Status: None   Collection Time: 05/05/20  9:36 PM   Specimen: Urine, Random  Result Value Ref Range Status   Specimen Description   Final    URINE, RANDOM Performed at [facility], [address] [place], [place] [address]    Special Requests  Final    NONE Performed at [facility], [address], [place], [place] [address]    Culture   Final    NO GROWTH Performed at [facility], [address] [place], [place] [address]    Report Status 05/07/2020 FINAL  Final  Body fluid culture w Gram Stain     Status: None (Preliminary result)   Collection Time: 05/07/20  11:55 AM   Specimen: PATH Cytology Pleural fluid  Result Value Ref Range Status   Specimen Description   Final    PLEURAL FLUID Performed at [facility], [address] [place], [place] [address]    Special Requests   Final    NONE Performed at [facility], [address] [place], [place] [address]    Gram Stain PENDING  Incomplete   Culture   Final    NO GROWTH < 12 HOURS Performed at [facility], [address], [place], [place] [address]    Report Status PENDING  Incomplete         Radiology Studies: DG Chest 1 View  Result Date: 05/07/2020 CLINICAL DATA:  Status post thoracentesis EXAM: CHEST  1 VIEW COMPARISON:  CT chest 05/07/2020 FINDINGS: Small right pleural effusion. Right basilar atelectasis. Left basilar atelectasis. No pneumothorax. Stable cardiomediastinal silhouette. No aggressive osseous lesion. IMPRESSION: No pneumothorax status post right thoracentesis. Small residual right pleural effusion with atelectasis. Electronically Signed   By: Elige Ko   On: 05/07/2020 12:46   DG Chest 2 View  Result Date: 05/07/2020 CLINICAL DATA:  Re-evaluate right pleural effusion. EXAM: CHEST - 2 VIEW COMPARISON:  Chest radiograph 03/07/2020 FINDINGS: There has been interval increase of bibasilar opacities. A small effusion present on the right. There is increasing left basilar opacity which is likely due to airspace disease rather than effusion. IMPRESSION: Interval worsening of bibasilar airspace opacities suspicious for progressive pneumonia. Small right pleural effusion is seen. Electronically Signed   By: Acquanetta Belling M.D.   On: 05/07/2020 09:33   CT CHEST W CONTRAST  Result Date: 05/07/2020 CLINICAL DATA:  40 year old gentleman with pneumonia and persistent fevers. EXAM: CT CHEST WITH CONTRAST TECHNIQUE: Multidetector CT imaging of the chest was performed during intravenous contrast administration. CONTRAST:  79mL OMNIPAQUE IOHEXOL 300 MG/ML  SOLN  COMPARISON:  CT chest 05/05/2020 FINDINGS: Cardiovascular: No significant vascular findings. Normal heart size. No pericardial effusion. Mediastinum/Nodes: No enlarged mediastinal, hilar, or axillary lymph nodes. Thyroid gland, trachea, and esophagus demonstrate no significant findings. Lungs/Pleura: Moderate right and trace left pleural effusion. There is complete atelectasis of the right lower lobe. Upper Abdomen: Diffuse hepatic steatosis. Musculoskeletal: No chest wall abnormality. No acute or significant osseous findings. IMPRESSION: Moderate-sized right pleural effusion with complete atelectasis/opacification of the right lower lobe. Mild patchy opacity in the left lower lobe may be due to atelectasis or additional focus of pneumonia. Electronically Signed   By: Acquanetta Belling M.D.   On: 05/07/2020 10:46   DG Chest Port 1 View  Result Date: 05/07/2020 CLINICAL DATA:  Dyspnea EXAM: PORTABLE CHEST 1 VIEW COMPARISON:  05/07/2020 at 11:57 a.m. FINDINGS: Single frontal view of the chest demonstrates a stable cardiac silhouette. Continued hypoventilatory changes at the lung bases. Trace residual right pleural effusion. No pneumothorax. IMPRESSION: 1. Persistent low lung volumes with bibasilar atelectasis. 2. Stable trace right pleural effusion. Electronically Signed   By: Sharlet Salina M.D.   On: 05/07/2020 22:06   US THORACENTESIS ASP PLEURAL SPACE W/IMG  GUIDE  Result Date: 05/07/2020 INDICATION: Shortness of breath. Pneumonia. Right-sided pleural effusion. Request for diagnostic and therapeutic thoracentesis. EXAM: ULTRASOUND GUIDED RIGHT THORACENTESIS MEDICATIONS: None. COMPLICATIONS: None immediate. Postprocedural chest x-ray negative for pneumothorax. PROCEDURE: An ultrasound guided thoracentesis was thoroughly discussed with the patient and questions answered. The benefits, risks, alternatives and complications were also discussed. The patient understands and wishes to proceed with the procedure.  Written consent was obtained. Ultrasound was performed to localize and mark an adequate pocket of fluid in the right chest. The area was then prepped and draped in the normal sterile fashion. 1% Lidocaine was used for local anesthesia. Under ultrasound guidance a 6 Fr Safe-T-Centesis catheter was introduced. Thoracentesis was performed. The catheter was removed and a dressing applied. FINDINGS: A total of approximately 300 mL of slightly hazy, amber pleural fluid was removed. Samples were sent to the laboratory as requested by the clinical team. IMPRESSION: Successful ultrasound guided right thoracentesis yielding 300 mL of pleural fluid. Read by: Brayton El PA-C Electronically Signed   By: Acquanetta Belling M.D.   On: 05/07/2020 12:54        Scheduled Meds: . guaiFENesin  600 mg Oral BID  . ipratropium-albuterol  3 mL Nebulization Q6H  . methylPREDNISolone (SOLU-MEDROL) injection  60 mg Intravenous Daily   Continuous Infusions: . sodium chloride 75 mL/hr at 05/08/20 0923  . cefTRIAXone (ROCEPHIN)  IV 2 g (05/07/20 1349)  . famotidine (PEPCID) IV       LOS: 3 days   Time spent: Greater than 50% of this time was spent in counseling, explanation of diagnosis, planning of further management, and coordination of care.   Voice Recognition Reubin Milan dictation system was used to create this note, attempts have been made to correct errors. Please contact the author with questions and/or clarifications.   Albertine Grates, MD PhD FACP [facility]  Available via Epic secure chat 7am-7pm for nonurgent issues Please page for urgent issues To page the attending provider between 7A-7P or the covering provider during after hours 7P-7A, please log into the web site [email or web address] and access using universal [place] password for that web site. If you do not have the password, please call the hospital operator.    05/08/2020, 12:22 PM

## 2020-05-09 DIAGNOSIS — A419 Sepsis, unspecified organism: Secondary | ICD-10-CM

## 2020-05-09 DIAGNOSIS — J90 Pleural effusion, not elsewhere classified: Secondary | ICD-10-CM

## 2020-05-09 LAB — BASIC METABOLIC PANEL
Anion gap: 6 (ref 5–15)
BUN: 14 mg/dL (ref 6–20)
CO2: 24 mmol/L (ref 22–32)
Calcium: 8.9 mg/dL (ref 8.9–10.3)
Chloride: 109 mmol/L (ref 98–111)
Creatinine, Ser: 0.95 mg/dL (ref 0.61–1.24)
GFR, Estimated: >60 mL/min (ref >60)
Glucose, Bld: 161 mg/dL — ABNORMAL HIGH (ref 70–99)
Potassium: 4 mmol/L (ref 3.5–5.1)
Sodium: 139 mmol/L (ref 135–145)

## 2020-05-09 LAB — PROCALCITONIN: Procalcitonin: 0.11 ng/mL

## 2020-05-09 LAB — TSH: TSH: 1.099 u[IU]/mL (ref 0.350–4.500)

## 2020-05-09 LAB — LACTIC ACID, PLASMA: Lactic Acid, Venous: 1.1 mmol/L (ref 0.5–1.9)

## 2020-05-09 MED ORDER — CEPHALEXIN 500 MG PO CAPS
500.0000 mg | ORAL_CAPSULE | Freq: Four times a day (QID) | ORAL | Status: DC
  Administered 2020-05-09: 500 mg via ORAL
  Filled 2020-05-09: qty 1

## 2020-05-09 MED ORDER — CEPHALEXIN 500 MG PO CAPS: 500.0000 mg | ORAL_CAPSULE | Freq: Two times a day (BID) | ORAL | Status: DC

## 2020-05-09 MED ORDER — CEPHALEXIN 500 MG PO CAPS: 500.0000 mg | ORAL_CAPSULE | Freq: Four times a day (QID) | ORAL | 0 refills | Status: AC

## 2020-05-09 MED ORDER — BENZONATATE 100 MG PO CAPS: 100.0000 mg | ORAL_CAPSULE | Freq: Three times a day (TID) | ORAL | 0 refills | Status: AC | PRN

## 2020-05-09 NOTE — Plan of Care (Signed)
  Problem: Education: Goal: Knowledge of General Education information will improve Description Including pain rating scale, medication(s)/side effects and non-pharmacologic comfort measures Outcome: Progressing   Problem: Health Behavior/Discharge Planning: Goal: Ability to manage health-related needs will improve Outcome: Progressing   

## 2020-05-09 NOTE — Discharge Summary (Signed)
[facility] [place] at [facility]   PATIENT NAME: Joel Joseph    MR#:  [id number]  DATE OF BIRTH:  [birth year]  DATE OF ADMISSION:  05/05/2020 ADMITTING PHYSICIAN: Lucile Shutters, MD  DATE OF DISCHARGE: 05/09/2020  PRIMARY CARE PHYSICIAN: Joel Dales, PA    ADMISSION DIAGNOSIS:  CAP (community acquired pneumonia) [J18.9] Right-sided chest pain [R07.9] Community acquired pneumonia of right lower lobe of lung [J18.9] Sepsis without acute organ dysfunction, due to unspecified organism (HCC) [A41.9]  DISCHARGE DIAGNOSIS:  CAP with right pleural Effusion s/p Thoracentesis Sepsis POA--resolved  SECONDARY DIAGNOSIS:  History reviewed. No pertinent past medical history.  HOSPITAL COURSE:  Joel Joseph a 40 y.o.malewith medical history significant forrecent inguinal hernia repair in the [place] who presents to the ER via private vehicle for evaluation of pain mostly in the right lower lateral chest wall associated with shortness of breath, fever and a cough. CTA negative for PE, found right lower lobe pneumonia.  Lobar pneumonia (RLL)/right pleural effusion /acute hypoxic respiratory failure/sepsis present on admission -Fever 102.6, tachypneic respiration rate ranged from 21-38 on presentation, tachycardia heart rate 127, lactic acidosis, lactic acid 2.1, leukocytosis, WBC 22.6 --sepsis resolved -CTA chest negative for PE,  -Blood culture no growth -COVID-19 screening negative -urine Legionella and urine strep pneumo antigen negative -Due to persistent symptom, repeat ct chest obtained on 4/30  and showed "Moderate-sized right pleural effusion with complete atelectasis/opacification of the right lower lobe. Mild patchy opacity in the left lower lobe may be due to atelectasis or additional focus of pneumonia." - s/p right thoracentesis on 4/30 with 300cc fluids removed follow up fluid  culture result few WBC, no growth -Continue Rocephin,  d/c zithro--change to po keflex (total 8 days) -improving, increase activity, out of bed--pt clinically feels near baseline  Lower lip swelling, on 5/1 am Possible zithromax allergy D/c zithro (urine strep pneumo antigen negative) Reports no issues with amoxicillin in the past, will continue rocephin for now --received Iv solumedrol/ pepcid/benadryl  --improved symptoms    patient's BMI is: Body mass index is 23.34 kg/m.Marland Kitchen  Clinically stable for d/c. D/w wife at bedside and questions answered  CONSULTS OBTAINED:    DRUG ALLERGIES:   Allergies  Allergen Reactions  . Azithromycin Swelling    Lips swollen    DISCHARGE MEDICATIONS:   Allergies as of 05/09/2020      Reactions   Azithromycin Swelling   Lips swollen      Medication List    STOP taking these medications   naproxen 500 MG EC tablet Commonly known as: EC NAPROSYN     TAKE these medications   acetaminophen 325 MG tablet Commonly known as: TYLENOL Take 650 mg by mouth every 6 (six) hours as needed.   benzonatate 100 MG capsule Commonly known as: TESSALON Take 1 capsule (100 mg total) by mouth 3 (three) times daily as needed for cough.   cephALEXin 500 MG capsule Commonly known as: KEFLEX Take 1 capsule (500 mg total) by mouth every 6 (six) hours for 3 days.       If you experience worsening of your admission symptoms, develop shortness of breath, life threatening emergency, suicidal or homicidal thoughts you must seek medical attention immediately by calling 911 or calling your MD immediately  if symptoms less severe.  You Must read complete instructions/literature along with all the possible adverse reactions/side effects for all the Medicines you take and that have been prescribed to you. Take any new Medicines after you  have completely understood and accept all the possible adverse reactions/side effects.   Please note  You were cared for by a hospitalist during your hospital stay. If you have  any questions about your discharge medications or the care you received while you were in the hospital after you are discharged, you can call the unit and asked to speak with the hospitalist on call if the hospitalist that took care of you is not available. Once you are discharged, your primary care physician will handle any further medical issues. Please note that NO REFILLS for any discharge medications will be authorized once you are discharged, as it is imperative that you return to your primary care physician (or establish a relationship with a primary care physician if you do not have one) for your aftercare needs so that they can reassess your need for medications and monitor your lab values. Today   SUBJECTIVE   Feels good  VITAL SIGNS:  Blood pressure 133/84, pulse 91, temperature 97.9 F (36.6 C), temperature source Oral, resp. rate 18, height 5\' 4"  (1.626 m), weight 61.7 kg, SpO2 96 %.  I/O:    Intake/Output Summary (Last 24 hours) at 05/09/2020 0914 Last data filed at 05/08/2020 2039 Gross per 24 hour  Intake 490 ml  Output --  Net 490 ml    PHYSICAL EXAMINATION:  GENERAL:  40 y.o.-year-old patient lying in the bed with no acute distress.  LUNGS: Normal breath sounds bilaterally, no wheezing, rales,rhonchi or crepitation. No use of accessory muscles of respiration.  CARDIOVASCULAR: S1, S2 normal. No murmurs, rubs, or gallops.  ABDOMEN: Soft, non-tender, non-distended. Bowel sounds present. No organomegaly or mass.  EXTREMITIES: No pedal edema, cyanosis, or clubbing.  NEUROLOGIC: Cranial nerves II through XII are intact. Muscle strength 5/5 in all extremities. Sensation intact. Gait not checked.  PSYCHIATRIC: The patient is alert and oriented x 3.  SKIN: No obvious rash, lesion, or ulcer.   DATA REVIEW:   CBC  Recent Labs  Lab 05/08/20 0527  WBC 8.9  HGB 11.3*  HCT 33.1*  PLT 263    Chemistries  Recent Labs  Lab 05/07/20 0509 05/09/20 0436  NA 139 139  K 3.7  4.0  CL 111 109  CO2 22 24  GLUCOSE 141* 161*  BUN 9 14  CREATININE 0.83 0.95  CALCIUM 8.3* 8.9  MG 2.0  --   AST 22  --   ALT 30  --   ALKPHOS 61  --   BILITOT 0.6  --     Microbiology Results   Recent Results (from the past 240 hour(s))  SARS CORONAVIRUS 2 (TAT 6-24 HRS) Nasopharyngeal Nasopharyngeal Swab     Status: None   Collection Time: 05/05/20  6:54 AM   Specimen: Nasopharyngeal Swab  Result Value Ref Range Status   SARS Coronavirus 2 NEGATIVE NEGATIVE Final    Comment: (NOTE) SARS-CoV-2 target nucleic acids are NOT DETECTED.  The SARS-CoV-2 RNA is generally detectable in upper and lower respiratory specimens during the acute phase of infection. Negative results do not preclude SARS-CoV-2 infection, do not rule out co-infections with other pathogens, and should not be used as the sole basis for treatment or other patient management decisions. Negative results must be combined with clinical observations, patient history, and epidemiological information. The expected result is Negative.  Fact Sheet for Patients: HairSlick.nohttps://www.fda.gov/media/138098/download  Fact Sheet for Healthcare Providers: quierodirigir.comhttps://www.fda.gov/media/138095/download  This test is not yet approved or cleared by the [place] FDA and  has been  authorized for detection and/or diagnosis of SARS-CoV-2 by FDA under an Emergency Use Authorization (EUA). This EUA will remain  in effect (meaning this test can be used) for the duration of the COVID-19 declaration under Se ction 564(b)(1) of the Act, 21 U.S.C. section 360bbb-3(b)(1), unless the authorization is terminated or revoked sooner.  Performed at [facility], [address] [place], [place] [address]   Culture, blood (routine x 2)     Status: None (Preliminary result)   Collection Time: 05/05/20  9:14 AM   Specimen: BLOOD  Result Value Ref Range Status   Specimen Description BLOOD BLOOD RIGHT FOREARM  Final   Special Requests    Final    BOTTLES DRAWN AEROBIC AND ANAEROBIC Blood Culture adequate volume   Culture   Final    NO GROWTH 4 DAYS Performed at [facility], [address], [place], [place] [address]    Report Status PENDING  Incomplete  Culture, blood (routine x 2)     Status: None (Preliminary result)   Collection Time: 05/05/20  9:16 AM   Specimen: BLOOD  Result Value Ref Range Status   Specimen Description BLOOD LEFT ANTECUBITAL  Final   Special Requests   Final    BOTTLES DRAWN AEROBIC AND ANAEROBIC Blood Culture adequate volume   Culture   Final    NO GROWTH 4 DAYS Performed at [facility], [address] [place], [place] [address]    Report Status PENDING  Incomplete  Urine Culture     Status: None   Collection Time: 05/05/20  9:36 PM   Specimen: Urine, Random  Result Value Ref Range Status   Specimen Description   Final    URINE, RANDOM Performed at [facility], [address], [place], [place] [address]    Special Requests   Final    NONE Performed at [facility], [address] [place], [place] [address]    Culture   Final    NO GROWTH Performed at [facility], [address], [place], [place] [address]    Report Status 05/07/2020 FINAL  Final  Body fluid culture w Gram Stain     Status: None (Preliminary result)   Collection Time: 05/07/20 11:55 AM   Specimen: PATH Cytology Pleural fluid  Result Value Ref Range Status   Specimen Description   Final    PLEURAL FLUID Performed at [facility], [address] [place], [place] [address]    Special Requests   Final    NONE Performed at [facility], [address] [place], [place] [address]    Gram Stain   Final    WBC PRESENT,BOTH PMN AND MONONUCLEAR NO ORGANISMS SEEN CYTOSPIN SMEAR    Culture   Final    NO GROWTH < 12 HOURS Performed at [facility], [address], [place], [place] [address]    Report Status PENDING  Incomplete  MRSA PCR  Screening     Status: None   Collection Time: 05/07/20  4:55 PM   Specimen: Nasopharyngeal  Result Value Ref Range Status   MRSA by PCR NEGATIVE NEGATIVE Final    Comment:        The GeneXpert MRSA Assay (FDA approved for NASAL specimens only), is one component of a comprehensive MRSA colonization surveillance program. It is not intended to diagnose MRSA infection nor to guide or monitor treatment for MRSA infections. Performed at [facility], [address] [place], [place] [address]     RADIOLOGY:  DG Chest 1 View  Result Date: 05/07/2020 CLINICAL DATA:  Status post thoracentesis EXAM: CHEST  1 VIEW COMPARISON:  CT chest 05/07/2020 FINDINGS: Small right pleural effusion. Right basilar atelectasis. Left basilar atelectasis. No pneumothorax. Stable cardiomediastinal silhouette. No aggressive osseous lesion. IMPRESSION: No pneumothorax status post right thoracentesis. Small residual right pleural effusion with atelectasis. Electronically Signed   By: Elige Ko   On: 05/07/2020 12:46   DG Chest 2 View  Result Date: 05/07/2020 CLINICAL DATA:  Re-evaluate right pleural effusion. EXAM: CHEST - 2 VIEW COMPARISON:  Chest radiograph 03/07/2020 FINDINGS: There has been interval increase of bibasilar opacities. A small effusion present on the right. There is increasing left basilar opacity which is likely due to airspace disease rather than effusion. IMPRESSION: Interval worsening of bibasilar airspace opacities suspicious for progressive pneumonia. Small right pleural effusion is seen. Electronically Signed   By: Acquanetta Belling M.D.   On: 05/07/2020 09:33   CT CHEST W CONTRAST  Result Date: 05/07/2020 CLINICAL DATA:  40 year old gentleman with pneumonia and persistent fevers. EXAM: CT CHEST WITH CONTRAST TECHNIQUE: Multidetector CT imaging of the chest was performed during intravenous contrast administration. CONTRAST:  75mL OMNIPAQUE IOHEXOL 300 MG/ML  SOLN COMPARISON:  CT chest  05/05/2020 FINDINGS: Cardiovascular: No significant vascular findings. Normal heart size. No pericardial effusion. Mediastinum/Nodes: No enlarged mediastinal, hilar, or axillary lymph nodes. Thyroid gland, trachea, and esophagus demonstrate no significant findings. Lungs/Pleura: Moderate right and trace left pleural effusion. There is complete atelectasis of the right lower lobe. Upper Abdomen: Diffuse hepatic steatosis. Musculoskeletal: No chest wall abnormality. No acute or significant osseous findings. IMPRESSION: Moderate-sized right pleural effusion with complete atelectasis/opacification of the right lower lobe. Mild patchy opacity in the left lower lobe may be due to atelectasis or additional focus of pneumonia. Electronically Signed   By: Acquanetta Belling M.D.   On: 05/07/2020 10:46   DG Chest Port 1 View  Result Date: 05/07/2020 CLINICAL DATA:  Dyspnea EXAM: PORTABLE CHEST 1 VIEW COMPARISON:  05/07/2020 at 11:57 a.m. FINDINGS: Single frontal view of the chest demonstrates a stable cardiac silhouette. Continued hypoventilatory changes at the lung bases. Trace residual right pleural effusion. No pneumothorax. IMPRESSION: 1. Persistent low lung volumes with bibasilar atelectasis. 2. Stable trace right pleural effusion. Electronically Signed   By: Sharlet Salina M.D.   On: 05/07/2020 22:06   US THORACENTESIS ASP PLEURAL SPACE W/IMG GUIDE  Result Date: 05/07/2020 INDICATION: Shortness of breath. Pneumonia. Right-sided pleural effusion. Request for diagnostic and therapeutic thoracentesis. EXAM: ULTRASOUND GUIDED RIGHT THORACENTESIS MEDICATIONS: None. COMPLICATIONS: None immediate. Postprocedural chest x-ray negative for pneumothorax. PROCEDURE: An ultrasound guided thoracentesis was thoroughly discussed with the patient and questions answered. The benefits, risks, alternatives and complications were also discussed. The patient understands and wishes to proceed with the procedure. Written consent was  obtained. Ultrasound was performed to localize and mark an adequate pocket of fluid in the right chest. The area was then prepped and draped in the normal sterile fashion. 1% Lidocaine was used for local anesthesia. Under ultrasound guidance a 6 Fr Safe-T-Centesis catheter was introduced. Thoracentesis was performed. The catheter was removed and a dressing applied. FINDINGS: A total of approximately 300 mL of slightly hazy, amber pleural fluid was removed. Samples were sent to the laboratory as requested by the clinical team. IMPRESSION: Successful ultrasound guided right thoracentesis yielding 300 mL of pleural fluid. Read by: Brayton El PA-C Electronically Signed   By:  Mauri Reading  Mir M.D.   On: 05/07/2020 12:54     CODE STATUS:     Code Status Orders  (From admission, onward)         Start     Ordered   05/05/20 0925  Full code  Continuous        05/05/20 0925        Code Status History    This patient has a current code status but no historical code status.   Advance Care Planning Activity       TOTAL TIME TAKING CARE OF THIS PATIENT: *40* minutes.    Enedina Finner M.D  [facility]    CC: Primary care physician; Joel Joseph, [place]

## 2020-05-09 NOTE — Progress Notes (Signed)
Worthy Flank to be D/C'd Home per MD order.  Discussed prescriptions and follow up appointments with the patient. Prescriptions given to patient, medication list explained in detail. Pt verbalized understanding.  Allergies as of 05/09/2020      Reactions   Azithromycin Swelling   Lips swollen      Medication List    STOP taking these medications   naproxen 500 MG EC tablet Commonly known as: EC NAPROSYN     TAKE these medications   acetaminophen 325 MG tablet Commonly known as: TYLENOL Take 650 mg by mouth every 6 (six) hours as needed.   benzonatate 100 MG capsule Commonly known as: TESSALON Take 1 capsule (100 mg total) by mouth 3 (three) times daily as needed for cough.   cephALEXin 500 MG capsule Commonly known as: KEFLEX Take 1 capsule (500 mg total) by mouth every 6 (six) hours for 3 days.       Vitals:   05/09/20 0810 05/09/20 0828  BP:  133/84  Pulse:  91  Resp:  18  Temp:  97.9 F (36.6 C)  SpO2: 93% 96%    Skin clean, dry and intact without evidence of skin break down, no evidence of skin tears noted. IV catheter discontinued intact. Site without signs and symptoms of complications. Dressing and pressure applied. Pt denies pain at this time. No complaints noted.  An After Visit Summary was printed and given to the patient. Patient escorted via WC, and D/C home via private auto.  Madie Reno, RN

## 2020-05-09 NOTE — Discharge Instructions (Signed)
Use your incentive spirometer at home.

## 2020-05-10 LAB — CULTURE, BLOOD (ROUTINE X 2)
Culture: NO GROWTH
Culture: NO GROWTH
Special Requests: ADEQUATE
Special Requests: ADEQUATE

## 2020-05-10 LAB — PH, BODY FLUID: pH, Body Fluid: 7.8

## 2020-05-11 LAB — BODY FLUID CULTURE W GRAM STAIN: Culture: NO GROWTH

## 2020-05-11 LAB — CYTOLOGY - NON PAP

## 2022-10-17 IMAGING — US US THORACENTESIS ASP PLEURAL SPACE W/IMG GUIDE
1 series · 9 of 9 positions shown · non-contrast
Comparison: none

INDICATION: Shortness of breath. Pneumonia. Right-sided pleural effusion.
Request for diagnostic and therapeutic thoracentesis.

[Series 1: us thoracentesis asp pleural s · 9 of 9 slices shown]
[im 1/9]
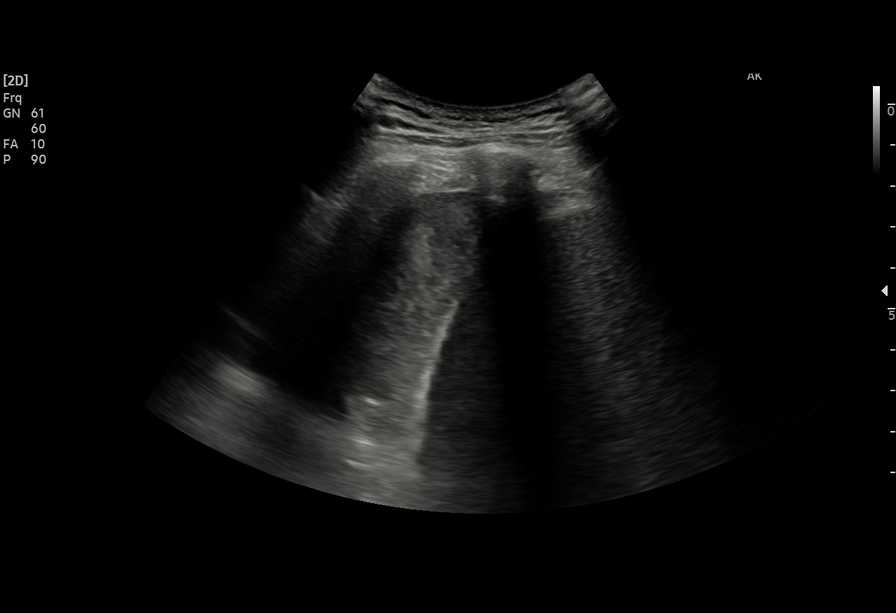
[im 2/9]
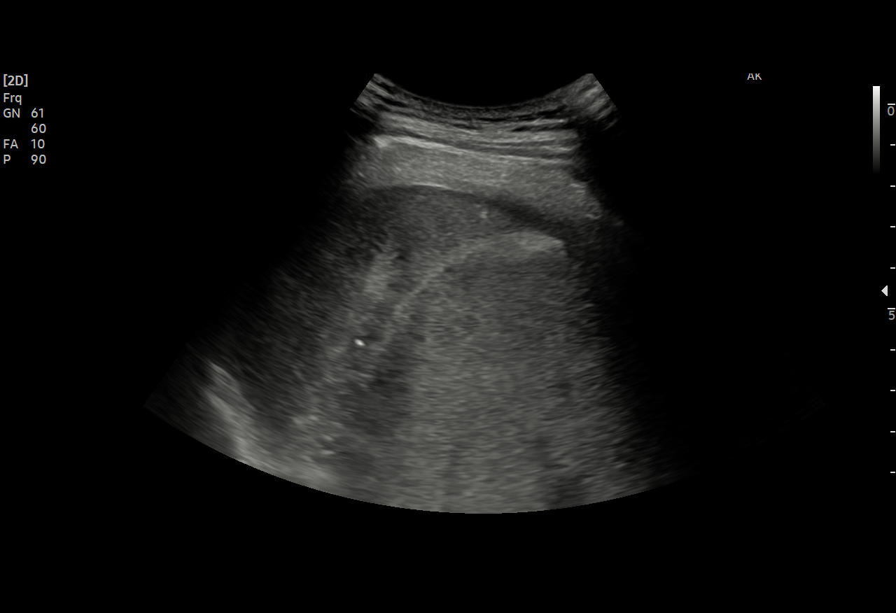
[im 3/9]
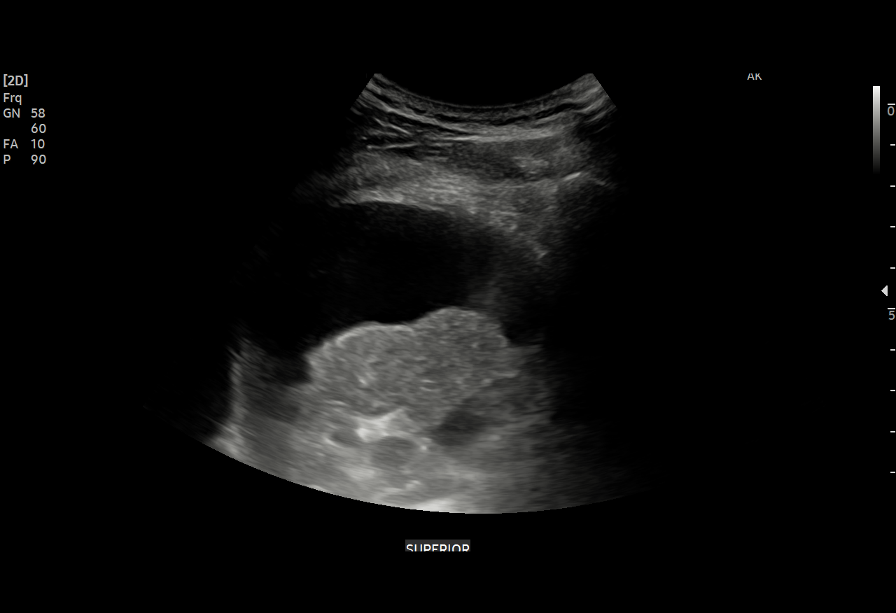
[im 4/9]
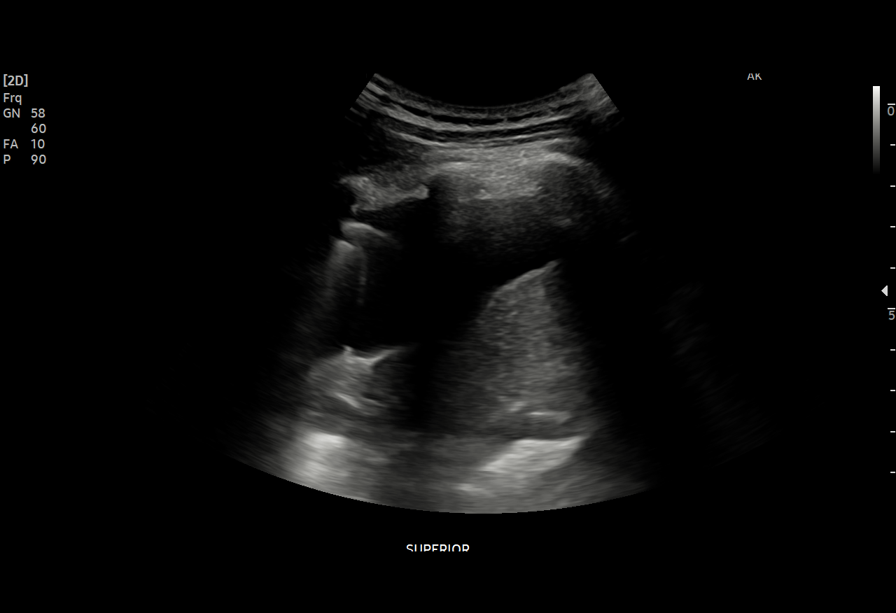
[im 5/9]
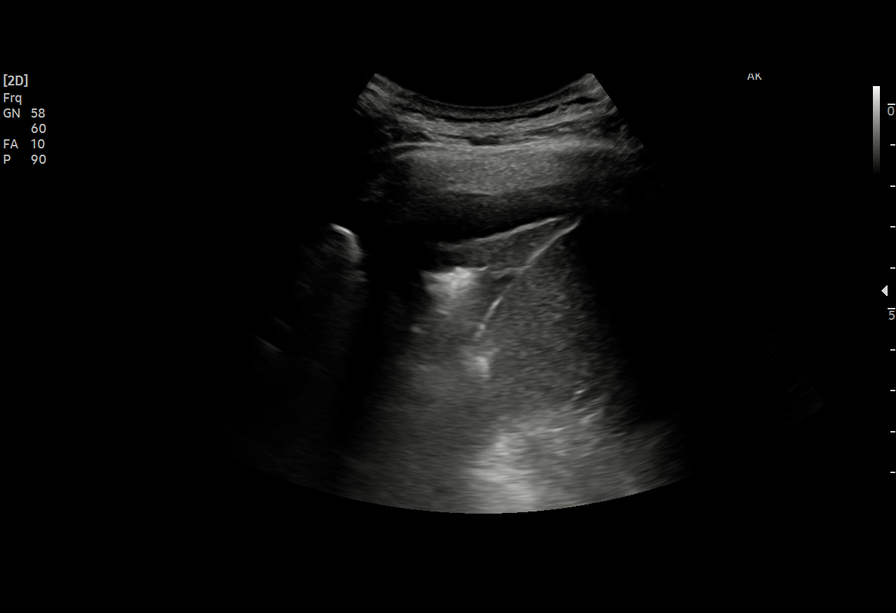
[im 6/9]
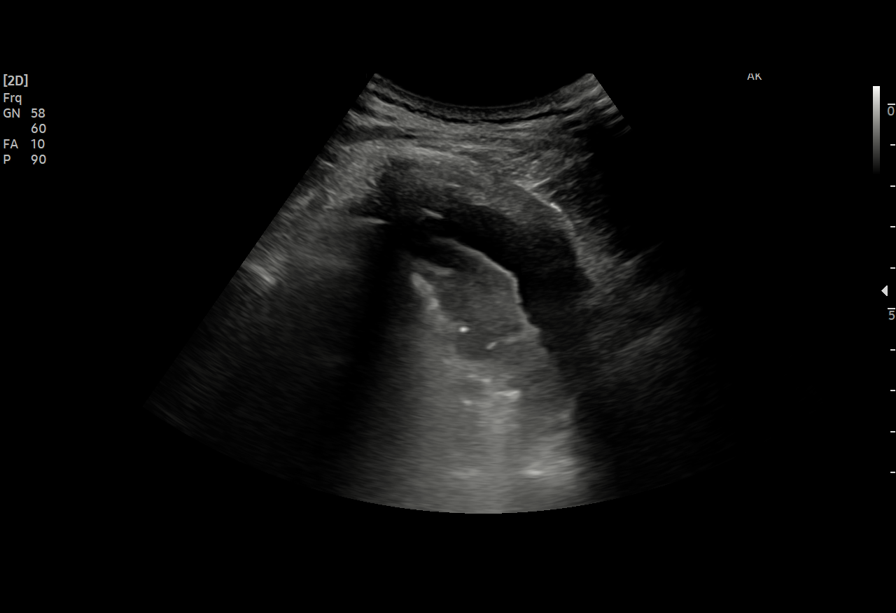
[im 7/9]
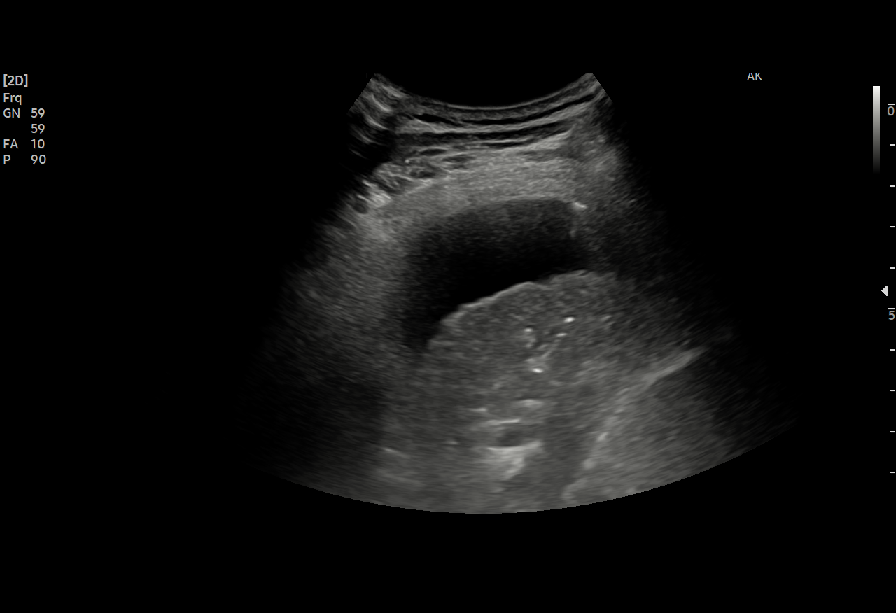
[im 8/9]
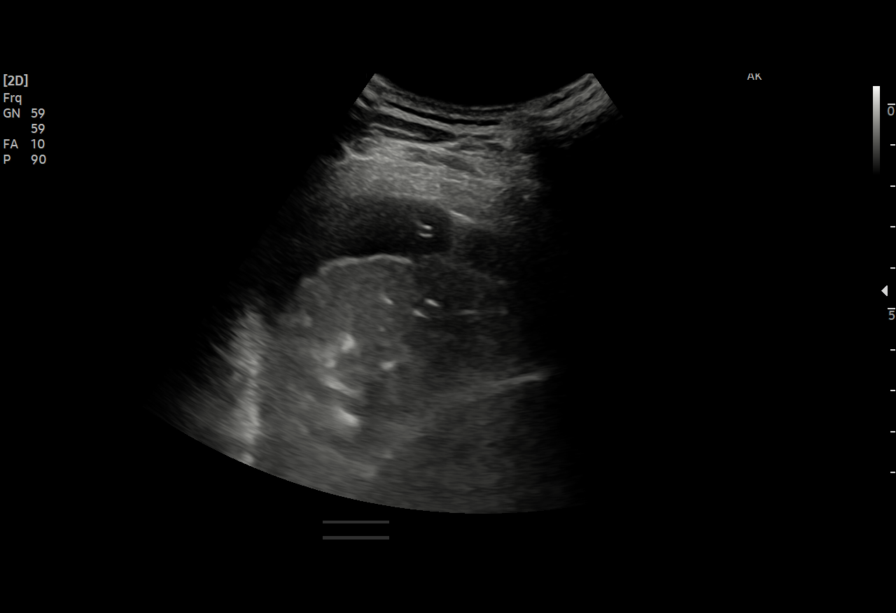
[im 9/9]
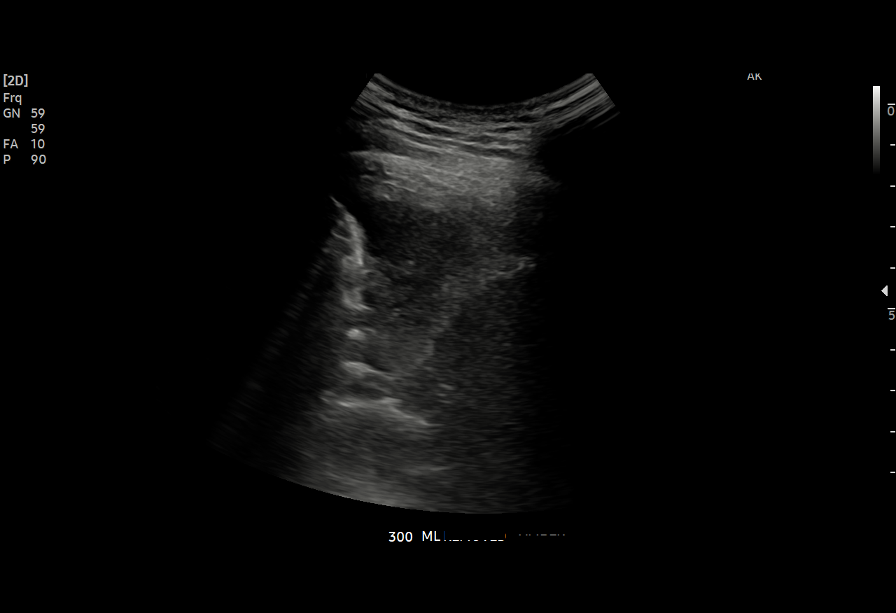

[9 of 9 positions shown; findings below may reference images not displayed]

EXAM:
ULTRASOUND GUIDED RIGHT THORACENTESIS

MEDICATIONS:
None.

COMPLICATIONS:
None immediate. Postprocedural chest x-ray negative for
pneumothorax.

PROCEDURE:
An ultrasound guided thoracentesis was thoroughly discussed with the
patient and questions answered. The benefits, risks, alternatives
and complications were also discussed. The patient understands and
wishes to proceed with the procedure. Written consent was obtained.

Ultrasound was performed to localize and mark an adequate pocket of
fluid in the right chest. The area was then prepped and draped in
the normal sterile fashion. 1% Lidocaine was used for local
anesthesia. Under ultrasound guidance a 6 Fr Safe-T-Centesis
catheter was introduced. Thoracentesis was performed. The catheter
was removed and a dressing applied.
FINDINGS: A total of approximately 300 mL of slightly hazy, amber pleural
fluid was removed. Samples were sent to the laboratory as requested
by the clinical team.
IMPRESSION: Successful ultrasound guided right thoracentesis yielding 300 mL of
pleural fluid.

## 2022-10-17 IMAGING — DX DG CHEST 1V
1 series · 1 of 1 positions shown · non-contrast
Comparison: CT chest 05/07/2020

CLINICAL DATA: Status post thoracentesis

EXAM:
CHEST  1 VIEW

[chest ap]
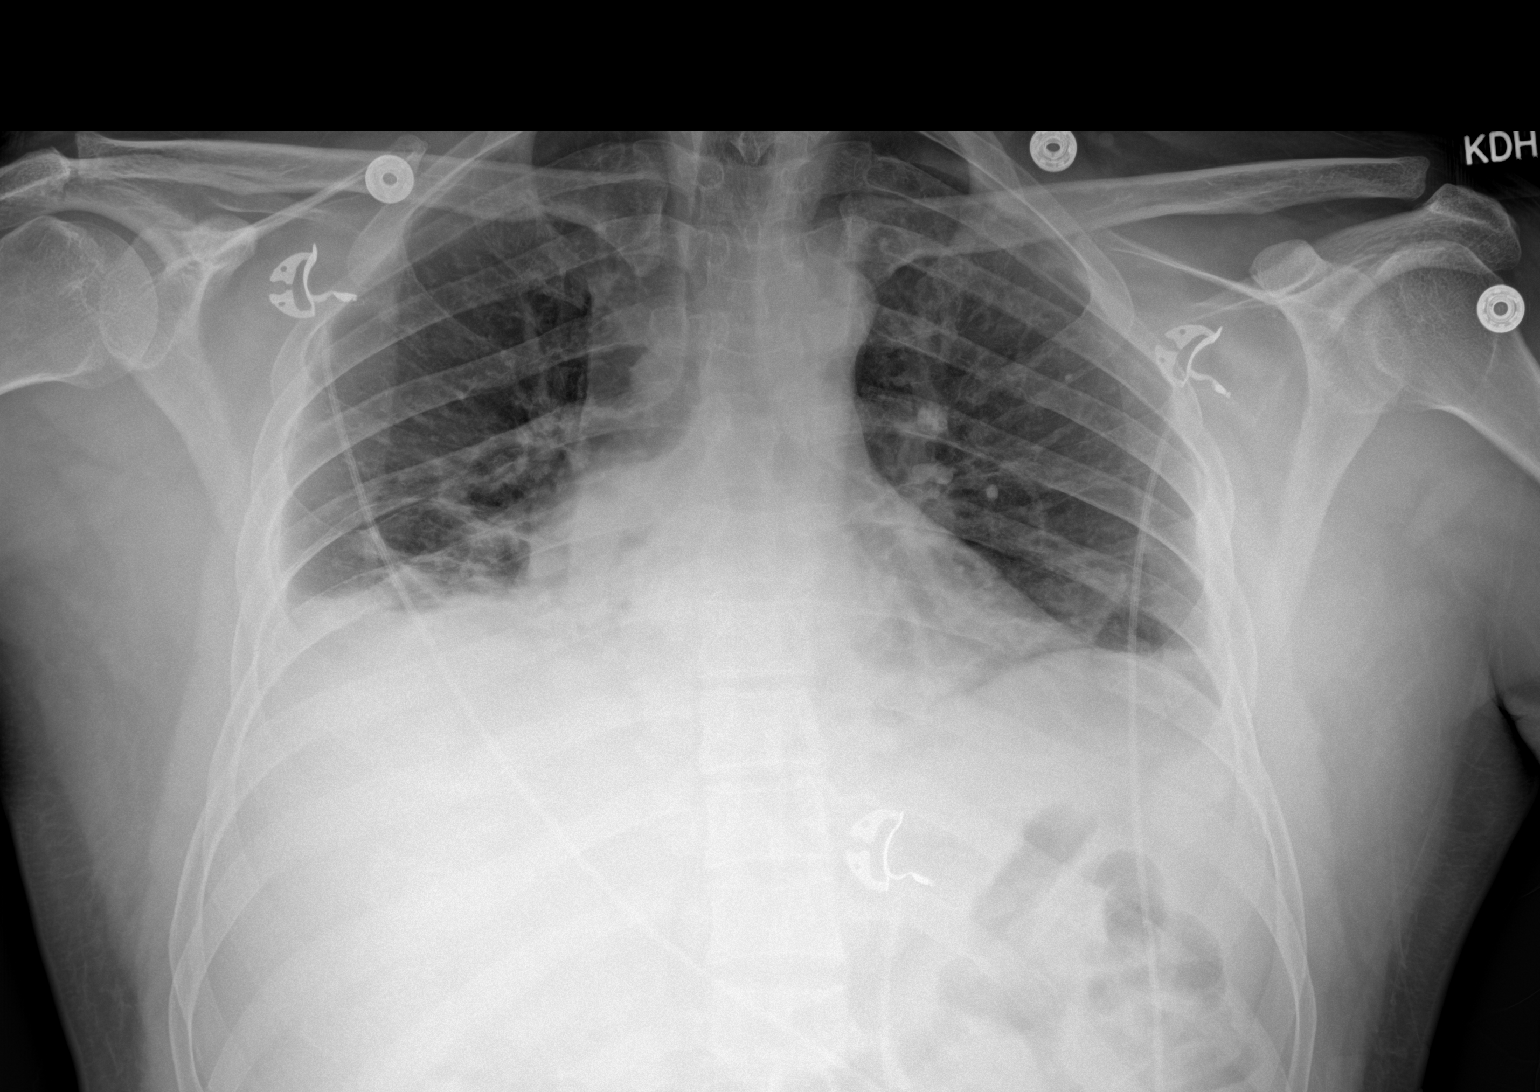

[1 of 1 positions shown; findings below may reference images not displayed]

FINDINGS: Small right pleural effusion. Right basilar atelectasis. Left
basilar atelectasis. No pneumothorax. Stable cardiomediastinal
silhouette. No aggressive osseous lesion.
IMPRESSION: No pneumothorax status post right thoracentesis. Small residual
right pleural effusion with atelectasis.

## 2024-02-05 ENCOUNTER — Emergency Department: Admission: EM | Admit: 2024-02-05 | Discharge: 2024-02-05 | Disposition: A | Discharge status: Home / Self Care

## 2024-02-05 ENCOUNTER — Other Ambulatory Visit: Payer: Self-pay

## 2024-02-05 ENCOUNTER — Emergency Department

## 2024-02-05 DIAGNOSIS — R202 Paresthesia of skin: Secondary | ICD-10-CM | POA: Insufficient documentation

## 2024-02-05 HISTORY — DX: Cerebral infarction, unspecified: I63.9

## 2024-02-05 LAB — BASIC METABOLIC PANEL WITH GFR
Anion gap: 11 (ref 5–15)
BUN: 13 mg/dL (ref 6–20)
CO2: 22 mmol/L (ref 22–32)
Calcium: 9.3 mg/dL (ref 8.9–10.3)
Chloride: 107 mmol/L (ref 98–111)
Creatinine, Ser: 0.91 mg/dL (ref 0.61–1.24)
GFR, Estimated: >60 mL/min
Glucose, Bld: 113 mg/dL — ABNORMAL HIGH (ref 70–99)
Potassium: 4.1 mmol/L (ref 3.5–5.1)
Sodium: 140 mmol/L (ref 135–145)

## 2024-02-05 LAB — CBC
HCT: 44.2 % (ref 39.0–52.0)
Hemoglobin: 15 g/dL (ref 13.0–17.0)
MCH: 27.3 pg (ref 26.0–34.0)
MCHC: 33.9 g/dL (ref 30.0–36.0)
MCV: 80.5 fL (ref 80.0–100.0)
Platelets: 190 10*3/uL (ref 150–400)
RBC: 5.49 MIL/uL (ref 4.22–5.81)
RDW: 13.7 % (ref 11.5–15.5)
WBC: 7.8 10*3/uL (ref 4.0–10.5)
nRBC: 0 % (ref 0.0–0.2)

## 2024-02-05 MED ORDER — IBUPROFEN 200 MG PO TABS: 600.0000 mg | ORAL_TABLET | Freq: Three times a day (TID) | ORAL | 2 refills | Status: AC | PRN

## 2024-02-05 MED ORDER — DIPHENHYDRAMINE HCL 25 MG PO CAPS: 25.0000 mg | ORAL_CAPSULE | Freq: Four times a day (QID) | ORAL | 0 refills | Status: AC | PRN

## 2024-02-05 NOTE — Discharge Instructions (Signed)
 Your evaluation in the emergency department was overall reassuring, and we saw no evidence of recurrent stroke.  Please do follow-up with your primary care provider and neurologist for any recurrent symptoms, and return to the emergency department with any new or worsening symptoms.  Regarding the mild swelling around your right eye, it is possible there could have been a mild allergic irritation or benign inflammation, and you can try Benadryl  and ibuprofen  to help with this as needed.  //  Su evaluacin en urgencias fue, en general, tranquilizadora y no observamos evidencia de un ictus recurrente. Por favor, consulte con su mdico de cabecera y neurlogo si presenta sntomas recurrentes y regrese a urgencias ante cualquier sntoma nuevo o que empeore. En cuanto a la leve hinchazn alrededor del ojo derecho, es posible que se trate de una irritacin alrgica leve o una inflamacin benigna. Puede probar Benadryl  e ibuprofeno para aliviarla segn sea necesario.

## 2024-02-05 NOTE — ED Provider Notes (Signed)
 "  [facility] Provider Note    Event Date/Time   First MD Initiated Contact with Patient 02/05/24 1520     (approximate)   History   Dizziness and Headache  Pt to ED for dizziness, h/a started this am. Reports was having tingling to bilateral hands 2 days ago, symptoms have resolved. Ambulatory steady gait.  Reports worsening dizziness with movement.    HPI Joel Joseph is a 44 y.o. 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  Accompanied by family member who provides collateral. - No recent infectious symptoms including cough, congestion, sore throat, fever, vomiting, diarrhea - Has otherwise been in his usual state of health - Sounds as if the symptoms have been intermittent over the past 1-2 weeks but felt they were little worse today so decided to come to emergency department given his history of prior stroke - Currently asymptomatic. - Does work at [employer] and regularly lifts heavy objects - No saddle anesthesia, urinary fecal incontinence, lower extremity weakness, difficulty walking    Outpatient records reviewed including primary care visit from 06/13/2023 which describes prior hospitalization for stroke in [place] .  Presented with aphasia.  CT with attenuation of the posterior lateral left frontal lobe.  No mass effect or hemorrhage.  MRI with ischemic changes in left frontal lobe and occipital lobes.  CT neck with no large vessel occlusion.  Normal echocardiogram.  A1c normal.  Discharged on aspirin, Plavix, atorvastatin.     Physical Exam   Triage Vital Signs: ED Triage Vitals  Encounter Vitals Group     BP 02/05/24 1442 (!) 149/92     Girls Systolic  BP Percentile --      Girls Diastolic BP Percentile --      Boys Systolic BP Percentile --      Boys Diastolic BP Percentile --      Pulse Rate 02/05/24 1442 85     Resp 02/05/24 1442 16     Temp 02/05/24 1442 98.1 F (36.7 C)     Temp src --      SpO2 02/05/24 1442 95 %     Weight 02/05/24 1443 137 lb (62.1 kg)     Height 02/05/24 1443 5' 2 (1.575 m)     Head Circumference --      Peak Flow --      Pain Score 02/05/24 1443 3     Pain Loc --      Pain Education --      Exclude from Growth Chart --     Most recent vital signs: Vitals:   02/05/24 1442  BP: (!) 149/92  Pulse: 85  Resp: 16  Temp: 98.1 F (36.7 C)  SpO2: 95%     General: Awake, no distress.  HEENT: Normocephalic, atraumatic, EOMI, PERRL, ?trace R periorbital swelling appreciated.  No conjunctival injection, no discharge.  No overlying erythema around area of possible trace swelling.  Full neck range of motion without difficulty and without eliciting symptoms.  No midline neck pain.  No meningismus. CV:  Good peripheral perfusion. RRR, RP 2+ Resp:  Normal effort. CTAB Abd:  No distention. Nontender to deep palpation throughout Neuro:  Aox4, CN II-XII intact, FNF wnl, finger taps fast b/l, 5/5 strength in bilateral finger extension/grip, arm flexion/extension, EHL/FHL. BUE  AG 10+ sec no drift, BLE AG 5+ sec no drift. Ambulates with steady gait. SILT. Negative Rhomberg.  Very mild expressive aphasia noted.  No appreciable dysarthria.    ED Results / Procedures / Treatments   Labs (all labs ordered are listed, but only abnormal results are displayed) Labs Reviewed  BASIC METABOLIC PANEL WITH GFR - Abnormal; Notable for the following components:      Result Value   Glucose, Bld 113 (*)    All other components within normal limits  CBC     EKG  See ED course below   RADIOLOGY Radiology interpreted by myself and radiology report reviewed.  Some chronic strokes identified consistent with patient's  known history though no evidence of acute stroke.    PROCEDURES:  Critical Care performed: No  Procedures   MEDICATIONS ORDERED IN ED: Medications - No data to display   IMPRESSION / MDM / ASSESSMENT AND PLAN / ED COURSE  I reviewed the triage vital signs and the nursing notes.                              DDX/MDM/AP: Differential diagnosis includes, but is not limited to, possible subacute stroke, symptoms do not clearly localize to make me concerned for recurrent TIAs however.  Consider possibility of some cervical radiculopathy contributing though fortunately no midline neck pain here and no other red flag symptoms to suggest acute spinal cord pathology.  Consider underlying electrolyte abnormality, anemia.  No clear symptoms to suggest infectious source.  Doubt arrhythmia.  Fortunately already had extensive stroke workup less than 1 year ago, no underlying carotid pathology visualized, normal echo  Plan: - Basic labs - EKG - MRI brain  Patient's presentation is most consistent with acute presentation with potential threat to life or bodily function.  ED course below.  Labs unremarkable, MRI negative.  No evidence of acute pathology causing constellation of symptoms, suspect possible mild intermittent cervical radiculopathy.  Symptoms do not clearly correlate with TIA and had echo, telemetry, carotid imaging less than 1 year ago.  Regarding trace right periorbital edema, no evidence of acute pathology at this time, not clinically concern for cellulitis.  Consider possible benign inflammation or mild allergic reaction, recommend ibuprofen  and Benadryl  as needed.  Plan for PMD and neurology follow-up.  ED return precautions in place.  Patient and family agree with plan.  Clinical Course as of 02/05/24 1741  Wed Feb 05, 2024  1520 CBC, BMP reviewed, unremarkable [MM]  1521 Ecg = sinus rhythm, rate 93, no gross ST elevation or depression, isolated T wave inversion in lead III is  nonspecific significant repolarization abnormality, normal axis, normal intervals.  No clear evidence of ischemia nor arrhythmia on my interpretation. [MM]  1709 MRI brain: IMPRESSION: 1. No acute intracranial abnormality. 2. Multiple small chronic infarcts as above.   [MM]    Clinical Course User Index [MM] Clarine Ozell LABOR, MD     FINAL CLINICAL IMPRESSION(S) / ED DIAGNOSES   Final diagnoses:  Paresthesia     Rx / DC Orders   ED Discharge Orders          Ordered    ibuprofen  (MOTRIN  IB) 200 MG tablet  Every 8 hours PRN        02/05/24 1738    diphenhydrAMINE  (BENADRYL  ALLERGY) 25 mg capsule  Every 6 hours PRN        02/05/24 1738  Note:  This document was prepared using Dragon voice recognition software and may include unintentional dictation errors.   Clarine Ozell LABOR, MD 02/05/24 1741  "

## 2024-02-05 NOTE — ED Notes (Signed)
 Patient to MRI.

## 2024-02-05 NOTE — ED Triage Notes (Signed)
 Pt to ED for dizziness, h/a started this am. Reports was having tingling to bilateral hands 2 days ago, symptoms have resolved. Ambulatory steady gait.  Reports worsening dizziness with movement.
